# Patient Record
Sex: Female | Born: 1985 | Race: White | Hispanic: No | Marital: Single | Smoking: Former smoker
Health system: Southern US, Community
[De-identification: ages and names within clinical notes are randomized; demographics above are authoritative.]

## PROBLEM LIST (undated history)

## (undated) DIAGNOSIS — IMO0002 Reserved for concepts with insufficient information to code with codable children: Secondary | ICD-10-CM

## (undated) DIAGNOSIS — M329 Systemic lupus erythematosus, unspecified: Secondary | ICD-10-CM

## (undated) DIAGNOSIS — B1920 Unspecified viral hepatitis C without hepatic coma: Secondary | ICD-10-CM

## (undated) HISTORY — PX: FACIAL RECONSTRUCTION SURGERY: SHX631

## (undated) HISTORY — PX: RESECTION TEMPORAL BONE: SUR1252

## (undated) HISTORY — DX: Reserved for concepts with insufficient information to code with codable children: IMO0002

## (undated) HISTORY — DX: Systemic lupus erythematosus, unspecified: M32.9

## (undated) HISTORY — DX: Unspecified viral hepatitis C without hepatic coma: B19.20

## (undated) HISTORY — PX: EYE SURGERY: SHX253

## (undated) HISTORY — PX: HIP SURGERY: SHX245

---

## 2010-03-02 ENCOUNTER — Emergency Department (HOSPITAL_COMMUNITY): Admission: EM | Admit: 2010-03-02 | Discharge: 2010-03-03 | Payer: Self-pay | Admitting: Emergency Medicine

## 2011-01-28 LAB — URINALYSIS, ROUTINE W REFLEX MICROSCOPIC
Bilirubin Urine: NEGATIVE
Hgb urine dipstick: NEGATIVE
Ketones, ur: NEGATIVE mg/dL
Nitrite: NEGATIVE
Protein, ur: NEGATIVE mg/dL
Urobilinogen, UA: 0.2 mg/dL (ref 0.0–1.0)

## 2011-01-28 LAB — WET PREP, GENITAL
Trich, Wet Prep: NONE SEEN
WBC, Wet Prep HPF POC: NONE SEEN
Yeast Wet Prep HPF POC: NONE SEEN

## 2011-01-28 LAB — POCT PREGNANCY, URINE: Preg Test, Ur: NEGATIVE

## 2011-01-28 LAB — GC/CHLAMYDIA PROBE AMP, GENITAL
Chlamydia, DNA Probe: NEGATIVE
GC Probe Amp, Genital: NEGATIVE

## 2014-08-30 DIAGNOSIS — F1111 Opioid abuse, in remission: Secondary | ICD-10-CM | POA: Insufficient documentation

## 2014-08-30 DIAGNOSIS — F909 Attention-deficit hyperactivity disorder, unspecified type: Secondary | ICD-10-CM | POA: Insufficient documentation

## 2014-08-30 DIAGNOSIS — F419 Anxiety disorder, unspecified: Secondary | ICD-10-CM | POA: Insufficient documentation

## 2016-10-07 DIAGNOSIS — Z9889 Other specified postprocedural states: Secondary | ICD-10-CM | POA: Insufficient documentation

## 2016-10-21 DIAGNOSIS — F1420 Cocaine dependence, uncomplicated: Secondary | ICD-10-CM | POA: Insufficient documentation

## 2016-10-21 DIAGNOSIS — F1120 Opioid dependence, uncomplicated: Secondary | ICD-10-CM | POA: Insufficient documentation

## 2018-01-08 ENCOUNTER — Encounter: Payer: Self-pay | Admitting: Obstetrics and Gynecology

## 2018-01-08 ENCOUNTER — Ambulatory Visit (INDEPENDENT_AMBULATORY_CARE_PROVIDER_SITE_OTHER): Admitting: Obstetrics and Gynecology

## 2018-01-08 VITALS — BP 122/82 | HR 76 | Ht 67.0 in | Wt 147.7 lb

## 2018-01-08 DIAGNOSIS — Z3202 Encounter for pregnancy test, result negative: Secondary | ICD-10-CM | POA: Diagnosis not present

## 2018-01-08 DIAGNOSIS — Z23 Encounter for immunization: Secondary | ICD-10-CM

## 2018-01-08 DIAGNOSIS — O0990 Supervision of high risk pregnancy, unspecified, unspecified trimester: Secondary | ICD-10-CM | POA: Insufficient documentation

## 2018-01-08 DIAGNOSIS — Z3687 Encounter for antenatal screening for uncertain dates: Secondary | ICD-10-CM

## 2018-01-08 LAB — POCT URINE PREGNANCY: PREG TEST UR: POSITIVE — AB

## 2018-01-08 NOTE — Patient Instructions (Signed)
First Trimester of Pregnancy The first trimester of pregnancy is from week 1 until the end of week 13 (months 1 through 3). A week after a sperm fertilizes an egg, the egg will implant on the wall of the uterus. This embryo will begin to develop into a baby. Genes from you and your partner will form the baby. The female genes will determine whether the baby will be a boy or a girl. At 6-8 weeks, the eyes and face will be formed, and the heartbeat can be seen on ultrasound. At the end of 12 weeks, all the baby's organs will be formed. Now that you are pregnant, you will want to do everything you can to have a healthy baby. Two of the most important things are to get good prenatal care and to follow your health care provider's instructions. Prenatal care is all the medical care you receive before the baby's birth. This care will help prevent, find, and treat any problems during the pregnancy and childbirth. Body changes during your first trimester Your body goes through many changes during pregnancy. The changes vary from woman to woman.  You may gain or lose a couple of pounds at first.  You may feel sick to your stomach (nauseous) and you may throw up (vomit). If the vomiting is uncontrollable, call your health care provider.  You may tire easily.  You may develop headaches that can be relieved by medicines. All medicines should be approved by your health care provider.  You may urinate more often. Painful urination may mean you have a bladder infection.  You may develop heartburn as a result of your pregnancy.  You may develop constipation because certain hormones are causing the muscles that push stool through your intestines to slow down.  You may develop hemorrhoids or swollen veins (varicose veins).  Your breasts may begin to grow larger and become tender. Your nipples may stick out more, and the tissue that surrounds them (areola) may become darker.  Your gums may bleed and may be  sensitive to brushing and flossing.  Dark spots or blotches (chloasma, mask of pregnancy) may develop on your face. This will likely fade after the baby is born.  Your menstrual periods will stop.  You may have a loss of appetite.  You may develop cravings for certain kinds of food.  You may have changes in your emotions from day to day, such as being excited to be pregnant or being concerned that something may go wrong with the pregnancy and baby.  You may have more vivid and strange dreams.  You may have changes in your hair. These can include thickening of your hair, rapid growth, and changes in texture. Some women also have hair loss during or after pregnancy, or hair that feels dry or thin. Your hair will most likely return to normal after your baby is born.  What to expect at prenatal visits During a routine prenatal visit:  You will be weighed to make sure you and the baby are growing normally.  Your blood pressure will be taken.  Your abdomen will be measured to track your baby's growth.  The fetal heartbeat will be listened to between weeks 10 and 14 of your pregnancy.  Test results from any previous visits will be discussed.  Your health care provider may ask you:  How you are feeling.  If you are feeling the baby move.  If you have had any abnormal symptoms, such as leaking fluid, bleeding, severe headaches,   or abdominal cramping.  If you are using any tobacco products, including cigarettes, chewing tobacco, and electronic cigarettes.  If you have any questions.  Other tests that may be performed during your first trimester include:  Blood tests to find your blood type and to check for the presence of any previous infections. The tests will also be used to check for low iron levels (anemia) and protein on red blood cells (Rh antibodies). Depending on your risk factors, or if you previously had diabetes during pregnancy, you may have tests to check for high blood  sugar that affects pregnant women (gestational diabetes).  Urine tests to check for infections, diabetes, or protein in the urine.  An ultrasound to confirm the proper growth and development of the baby.  Fetal screens for spinal cord problems (spina bifida) and Down syndrome.  HIV (human immunodeficiency virus) testing. Routine prenatal testing includes screening for HIV, unless you choose not to have this test.  You may need other tests to make sure you and the baby are doing well.  Follow these instructions at home: Medicines  Follow your health care provider's instructions regarding medicine use. Specific medicines may be either safe or unsafe to take during pregnancy.  Take a prenatal vitamin that contains at least 600 micrograms (mcg) of folic acid.  If you develop constipation, try taking a stool softener if your health care provider approves. Eating and drinking  Eat a balanced diet that includes fresh fruits and vegetables, whole grains, good sources of protein such as meat, eggs, or tofu, and low-fat dairy. Your health care provider will help you determine the amount of weight gain that is right for you.  Avoid raw meat and uncooked cheese. These carry germs that can cause birth defects in the baby.  Eating four or five small meals rather than three large meals a day may help relieve nausea and vomiting. If you start to feel nauseous, eating a few soda crackers can be helpful. Drinking liquids between meals, instead of during meals, also seems to help ease nausea and vomiting.  Limit foods that are high in fat and processed sugars, such as fried and sweet foods.  To prevent constipation: ? Eat foods that are high in fiber, such as fresh fruits and vegetables, whole grains, and beans. ? Drink enough fluid to keep your urine clear or pale yellow. Activity  Exercise only as directed by your health care provider. Most women can continue their usual exercise routine during  pregnancy. Try to exercise for 30 minutes at least 5 days a week. Exercising will help you: ? Control your weight. ? Stay in shape. ? Be prepared for labor and delivery.  Experiencing pain or cramping in the lower abdomen or lower back is a good sign that you should stop exercising. Check with your health care provider before continuing with normal exercises.  Try to avoid standing for long periods of time. Move your legs often if you must stand in one place for a long time.  Avoid heavy lifting.  Wear low-heeled shoes and practice good posture.  You may continue to have sex unless your health care provider tells you not to. Relieving pain and discomfort  Wear a good support bra to relieve breast tenderness.  Take warm sitz baths to soothe any pain or discomfort caused by hemorrhoids. Use hemorrhoid cream if your health care provider approves.  Rest with your legs elevated if you have leg cramps or low back pain.  If you develop   varicose veins in your legs, wear support hose. Elevate your feet for 15 minutes, 3-4 times a day. Limit salt in your diet. Prenatal care  Schedule your prenatal visits by the twelfth week of pregnancy. They are usually scheduled monthly at first, then more often in the last 2 months before delivery.  Write down your questions. Take them to your prenatal visits.  Keep all your prenatal visits as told by your health care provider. This is important. Safety  Wear your seat belt at all times when driving.  Make a list of emergency phone numbers, including numbers for family, friends, the hospital, and police and fire departments. General instructions  Ask your health care provider for a referral to a local prenatal education class. Begin classes no later than the beginning of month 6 of your pregnancy.  Ask for help if you have counseling or nutritional needs during pregnancy. Your health care provider can offer advice or refer you to specialists for help  with various needs.  Do not use hot tubs, steam rooms, or saunas.  Do not douche or use tampons or scented sanitary pads.  Do not cross your legs for long periods of time.  Avoid cat litter boxes and soil used by cats. These carry germs that can cause birth defects in the baby and possibly loss of the fetus by miscarriage or stillbirth.  Avoid all smoking, herbs, alcohol, and medicines not prescribed by your health care provider. Chemicals in these products affect the formation and growth of the baby.  Do not use any products that contain nicotine or tobacco, such as cigarettes and e-cigarettes. If you need help quitting, ask your health care provider. You may receive counseling support and other resources to help you quit.  Schedule a dentist appointment. At home, brush your teeth with a soft toothbrush and be gentle when you floss. Contact a health care provider if:  You have dizziness.  You have mild pelvic cramps, pelvic pressure, or nagging pain in the abdominal area.  You have persistent nausea, vomiting, or diarrhea.  You have a bad smelling vaginal discharge.  You have pain when you urinate.  You notice increased swelling in your face, hands, legs, or ankles.  You are exposed to fifth disease or chickenpox.  You are exposed to German measles (rubella) and have never had it. Get help right away if:  You have a fever.  You are leaking fluid from your vagina.  You have spotting or bleeding from your vagina.  You have severe abdominal cramping or pain.  You have rapid weight gain or loss.  You vomit blood or material that looks like coffee grounds.  You develop a severe headache.  You have shortness of breath.  You have any kind of trauma, such as from a fall or a car accident. Summary  The first trimester of pregnancy is from week 1 until the end of week 13 (months 1 through 3).  Your body goes through many changes during pregnancy. The changes vary from  woman to woman.  You will have routine prenatal visits. During those visits, your health care provider will examine you, discuss any test results you may have, and talk with you about how you are feeling. This information is not intended to replace advice given to you by your health care provider. Make sure you discuss any questions you have with your health care provider. Document Released: 10/21/2001 Document Revised: 10/08/2016 Document Reviewed: 10/08/2016 Elsevier Interactive Patient Education  2018 [employer]   Inc[employer]  

## 2018-01-08 NOTE — Progress Notes (Signed)
Pt with uncertain LMP, 8-[redacted] weeks gestation ? Will obtained for dating and once dates are confirmed, will bring pt back for new OB

## 2018-01-13 ENCOUNTER — Other Ambulatory Visit: Payer: Self-pay

## 2018-01-13 NOTE — Addendum Note (Signed)
Addended by: Hamilton CapriBURCH, Kosisochukwu Burningham J on: 01/13/2018 03:05 PM   Modules accepted: Orders

## 2018-01-22 ENCOUNTER — Ambulatory Visit (HOSPITAL_COMMUNITY)
Admission: RE | Admit: 2018-01-22 | Discharge: 2018-01-22 | Disposition: A | Discharge status: Home / Self Care | Source: Ambulatory Visit | Attending: Obstetrics and Gynecology | Admitting: Obstetrics and Gynecology

## 2018-01-22 ENCOUNTER — Ambulatory Visit (HOSPITAL_COMMUNITY)

## 2018-01-22 DIAGNOSIS — Z3A08 8 weeks gestation of pregnancy: Secondary | ICD-10-CM | POA: Diagnosis not present

## 2018-01-22 DIAGNOSIS — Z3687 Encounter for antenatal screening for uncertain dates: Secondary | ICD-10-CM | POA: Diagnosis present

## 2018-02-04 ENCOUNTER — Encounter: Payer: Self-pay | Admitting: Obstetrics and Gynecology

## 2018-02-04 ENCOUNTER — Other Ambulatory Visit (HOSPITAL_COMMUNITY)
Admission: RE | Admit: 2018-02-04 | Discharge: 2018-02-04 | Disposition: A | Discharge status: Home / Self Care | Source: Ambulatory Visit | Attending: Obstetrics and Gynecology | Admitting: Obstetrics and Gynecology

## 2018-02-04 ENCOUNTER — Ambulatory Visit (INDEPENDENT_AMBULATORY_CARE_PROVIDER_SITE_OTHER): Admitting: Obstetrics and Gynecology

## 2018-02-04 DIAGNOSIS — O0991 Supervision of high risk pregnancy, unspecified, first trimester: Secondary | ICD-10-CM | POA: Diagnosis not present

## 2018-02-04 DIAGNOSIS — O99321 Drug use complicating pregnancy, first trimester: Secondary | ICD-10-CM | POA: Diagnosis not present

## 2018-02-04 DIAGNOSIS — O34219 Maternal care for unspecified type scar from previous cesarean delivery: Secondary | ICD-10-CM | POA: Insufficient documentation

## 2018-02-04 DIAGNOSIS — Z3A10 10 weeks gestation of pregnancy: Secondary | ICD-10-CM | POA: Insufficient documentation

## 2018-02-04 DIAGNOSIS — O99320 Drug use complicating pregnancy, unspecified trimester: Secondary | ICD-10-CM

## 2018-02-04 DIAGNOSIS — Z98891 History of uterine scar from previous surgery: Secondary | ICD-10-CM | POA: Insufficient documentation

## 2018-02-04 DIAGNOSIS — O0990 Supervision of high risk pregnancy, unspecified, unspecified trimester: Secondary | ICD-10-CM

## 2018-02-04 NOTE — Progress Notes (Signed)
   PRENATAL VISIT NOTE  Subjective:  Rebekah Chavez is a 32 y.o. [phone] at 4957w4d being seen today for initial prenatal care visit.  She is currently monitored for the following issues for this high-risk pregnancy and has Supervision of high risk pregnancy, antepartum; Previous cesarean section complicating pregnancy, antepartum condition or complication; and Substance abuse affecting pregnancy, antepartum on their problem list.  Patient reports the presence of a white discharge.  Contractions: Not present. Vag. Bleeding: None.  Movement: Absent. Denies leaking of fluid.   The following portions of the patient's history were reviewed and updated as appropriate: allergies, current medications, past family history, past medical history, past social history, past surgical history and problem list. Problem list updated.  Objective:   Vitals:   02/04/18 0813  BP: 102/69  Pulse: 80  Weight: 153 lb 8 oz (69.6 kg)    Fetal Status: Fetal Heart Rate (bpm): 160   Movement: Absent     General:  Alert, oriented and cooperative. Patient is in no acute distress.  Skin: Skin is warm and dry. No rash noted.   Cardiovascular: Normal heart rate noted  Respiratory: Normal respiratory effort, no problems with respiration noted  Abdomen: Soft, gravid, appropriate for gestational age.  Pain/Pressure: Absent     Pelvic: Cervical exam performed        Extremities: Normal range of motion.  Edema: None  Mental Status:  Normal mood and affect. Normal behavior. Normal judgment and thought content.   Assessment and Plan:  Pregnancy: [id number] at 4757w4d  1. Supervision of high risk pregnancy, antepartum Patient is doing well without complaints Reviewed dating with the patient - Culture, OB Urine - Obstetric Panel, Including HIV - Hemoglobinopathy evaluation - VITAMIN D 25 Hydroxy (Vit-D Deficiency, Fractures) - Cystic Fibrosis Mutation 97 - Cytology - PAP - Genetic Screening - Hemoglobin A1c - SMN1 COPY  NUMBER ANALYSIS (SMA Carrier Screen)  2. Previous cesarean section complicating pregnancy, antepartum condition or complication Patient states emergency c-section due to non reassuring NST at 34 weeks Records requested  3. 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 Please refer to After Visit Summary for other counseling recommendations.  Return in about 1 month (around 03/04/2018) for ROB.   Catalina AntiguaPeggy Rabecca Birge, MD

## 2018-02-04 NOTE — Addendum Note (Signed)
Addended by: Catalina AntiguaONSTANT, Sostenes Kauffmann on: 02/04/2018 08:53 AM   Modules accepted: Kipp BroodSmartSet

## 2018-02-04 NOTE — Progress Notes (Signed)
Pt c/o white discharge with odor x couple months.,

## 2018-02-05 LAB — VITAMIN D 25 HYDROXY (VIT D DEFICIENCY, FRACTURES): VIT D 25 HYDROXY: 29.9 ng/mL — AB (ref 30.0–100.0)

## 2018-02-06 LAB — URINE CULTURE, OB REFLEX

## 2018-02-06 LAB — CULTURE, OB URINE

## 2018-02-08 LAB — CYTOLOGY - PAP
Bacterial vaginitis: POSITIVE — AB
CANDIDA VAGINITIS: NEGATIVE
Chlamydia: NEGATIVE
DIAGNOSIS: NEGATIVE
HPV (WINDOPATH): DETECTED — AB
NEISSERIA GONORRHEA: NEGATIVE
TRICH (WINDOWPATH): POSITIVE — AB

## 2018-02-08 MED ORDER — METRONIDAZOLE 500 MG PO TABS: 500.0000 mg | ORAL_TABLET | Freq: Two times a day (BID) | ORAL | 0 refills | Status: DC

## 2018-02-08 NOTE — Addendum Note (Signed)
Addended by: Catalina AntiguaONSTANT, Kahlie Deutscher on: 02/08/2018 03:16 PM   Modules accepted: Orders

## 2018-02-09 ENCOUNTER — Telehealth: Payer: Self-pay

## 2018-02-09 NOTE — Telephone Encounter (Signed)
-----   Message from Catalina AntiguaPeggy Constant, MD sent at 02/08/2018  3:14 PM EDT ----- Please inform patient of BV and trich infection. She needs a prescription for Flagyl 500 mg twice daily for 7 days (disp 14 tablets without refills). Order in Epic Patient is currently incarcerated  Thanks  Peggy

## 2018-02-09 NOTE — Telephone Encounter (Signed)
TC to Sheriff's office "[place]" will help get patient treated once she receives copy of results and the medication order. Faxed today to secure fax [phone] atten: Cicero DuckErika.

## 2018-02-10 ENCOUNTER — Other Ambulatory Visit: Payer: Self-pay

## 2018-02-10 LAB — OBSTETRIC PANEL, INCLUDING HIV
Antibody Screen: NEGATIVE
BASOS ABS: 0 10*3/uL (ref 0.0–0.2)
Basos: 0 %
EOS (ABSOLUTE): 0.1 10*3/uL (ref 0.0–0.4)
Eos: 3 %
HIV SCREEN 4TH GENERATION: NONREACTIVE
Hematocrit: 39.4 % (ref 34.0–46.6)
Hemoglobin: 13 g/dL (ref 11.1–15.9)
Hepatitis B Surface Ag: NEGATIVE
IMMATURE GRANULOCYTES: 0 %
Immature Grans (Abs): 0 10*3/uL (ref 0.0–0.1)
Lymphocytes Absolute: 1.2 10*3/uL (ref 0.7–3.1)
Lymphs: 28 %
MCH: 28.6 pg (ref 26.6–33.0)
MCHC: 33 g/dL (ref 31.5–35.7)
MCV: 87 fL (ref 79–97)
MONOS ABS: 0.4 10*3/uL (ref 0.1–0.9)
Monocytes: 10 %
NEUTROS ABS: 2.4 10*3/uL (ref 1.4–7.0)
NEUTROS PCT: 59 %
PLATELETS: 202 10*3/uL (ref 150–379)
RBC: 4.54 x10E6/uL (ref 3.77–5.28)
RDW: 14.3 % (ref 12.3–15.4)
RPR Ser Ql: NONREACTIVE
Rh Factor: POSITIVE
Rubella Antibodies, IGG: 2.25 index (ref >0.99)
WBC: 4.2 10*3/uL (ref 3.4–10.8)

## 2018-02-10 LAB — HEMOGLOBINOPATHY EVALUATION
HEMOGLOBIN F QUANTITATION: 0 % (ref 0.0–2.0)
HGB A: 97.3 % (ref 96.4–98.8)
HGB C: 0 %
HGB S: 0 %
HGB VARIANT: 0 %
Hemoglobin A2 Quantitation: 2.7 % (ref 1.8–3.2)

## 2018-02-10 LAB — HEMOGLOBIN A1C
ESTIMATED AVERAGE GLUCOSE: 100 mg/dL
Hgb A1c MFr Bld: 5.1 % (ref 4.8–5.6)

## 2018-02-11 LAB — CYSTIC FIBROSIS MUTATION 97: Interpretation: NOT DETECTED

## 2018-02-16 LAB — SMN1 COPY NUMBER ANALYSIS (SMA CARRIER SCREENING)

## 2018-03-02 ENCOUNTER — Telehealth: Payer: Self-pay | Admitting: *Deleted

## 2018-03-02 NOTE — Telephone Encounter (Signed)
Call placed to pt to reschedule ROB appt. LM on VM to call office to schedule.

## 2018-03-04 ENCOUNTER — Encounter: Admitting: Obstetrics and Gynecology

## 2018-03-31 ENCOUNTER — Encounter: Payer: Medicaid Other | Admitting: Obstetrics & Gynecology

## 2018-04-20 ENCOUNTER — Encounter: Payer: Self-pay | Admitting: *Deleted

## 2018-04-28 ENCOUNTER — Telehealth: Payer: Self-pay | Admitting: *Deleted

## 2018-04-28 NOTE — Telephone Encounter (Signed)
Lft vmail for patient to callback and reschedule Ob visit, patient has missed several appts. Has not been seen since 01/2018.Marland Kitchen.Marland Kitchen..Marland Kitchen

## 2018-05-21 ENCOUNTER — Ambulatory Visit (INDEPENDENT_AMBULATORY_CARE_PROVIDER_SITE_OTHER): Payer: Medicaid Other | Admitting: Nurse Practitioner

## 2018-05-21 ENCOUNTER — Encounter: Payer: Self-pay | Admitting: *Deleted

## 2018-05-21 VITALS — BP 110/71 | HR 81 | Wt 162.2 lb

## 2018-05-21 DIAGNOSIS — O0930 Supervision of pregnancy with insufficient antenatal care, unspecified trimester: Secondary | ICD-10-CM

## 2018-05-21 DIAGNOSIS — O0932 Supervision of pregnancy with insufficient antenatal care, second trimester: Secondary | ICD-10-CM

## 2018-05-21 DIAGNOSIS — O99322 Drug use complicating pregnancy, second trimester: Secondary | ICD-10-CM

## 2018-05-21 DIAGNOSIS — A599 Trichomoniasis, unspecified: Secondary | ICD-10-CM | POA: Insufficient documentation

## 2018-05-21 DIAGNOSIS — O0992 Supervision of high risk pregnancy, unspecified, second trimester: Secondary | ICD-10-CM

## 2018-05-21 DIAGNOSIS — Z9889 Other specified postprocedural states: Secondary | ICD-10-CM

## 2018-05-21 DIAGNOSIS — O0990 Supervision of high risk pregnancy, unspecified, unspecified trimester: Secondary | ICD-10-CM

## 2018-05-21 DIAGNOSIS — O99320 Drug use complicating pregnancy, unspecified trimester: Secondary | ICD-10-CM

## 2018-05-21 DIAGNOSIS — Z1589 Genetic susceptibility to other disease: Secondary | ICD-10-CM

## 2018-05-21 MED ORDER — METRONIDAZOLE 500 MG PO TABS: 2000.0000 mg | ORAL_TABLET | Freq: Once | ORAL | 0 refills | Status: AC

## 2018-05-21 MED ORDER — POLYETHYLENE GLYCOL 3350 17 GM/SCOOP PO POWD: ORAL | 11 refills | Status: AC

## 2018-05-21 MED ORDER — CONCEPT OB 130-92.4-1 MG PO CAPS: 1.0000 | ORAL_CAPSULE | Freq: Every day | ORAL | 11 refills | Status: AC

## 2018-05-21 NOTE — Progress Notes (Signed)
[facility]   Subjective:  Rebekah CruiseShelly Strege is a 32 y.o. [phone] at 6260w5d being seen today for ongoing prenatal care.  She is currently monitored for the following issues for this high-risk pregnancy and has Supervision of high risk pregnancy, antepartum; Previous cesarean section complicating pregnancy, antepartum condition or complication; Substance abuse affecting pregnancy, antepartum; Late prenatal care affecting pregnancy, antepartum; History of facial surgery; Monoallelic mutation of SMN1 gene; and Trichimoniasis on their problem list.  Patient reports restarting care again since March 2010.  Having some vaginal discharge - partner was not treated for trich and she has been having sex so she thinks it is a reinfection.  Asking for medication again and medication for her partner.  .  Contractions: Not present. Vag. Bleeding: None.  Movement: Present. Denies leaking of fluid.   The following portions of the patient's history were reviewed and updated as appropriate: allergies, current medications, past family history, past medical history, past social history, past surgical history and problem list. Problem list updated.  Objective:   Vitals:   05/21/18 1025  BP: 110/71  Pulse: 81  Weight: 162 lb 3.2 oz (73.6 kg)    Fetal Status: Fetal Heart Rate (bpm): 132 Fundal Height: 23 cm Movement: Present     General:  Alert, oriented and cooperative. Patient is in no acute distress.  Skin: Skin is warm and dry. No rash noted.   Cardiovascular: Normal heart rate noted  Respiratory: Normal respiratory effort, no problems with respiration noted  Abdomen: Soft, gravid, appropriate for gestational age. Pain/Pressure: Absent     Pelvic:  Cervical exam deferred        Extremities: Normal range of motion.  Edema: None  Mental Status: Normal mood and affect. Normal behavior. Normal judgment and thought content.     Assessment and Plan:  Pregnancy: [id number] at 1060w5d 1.  Supervision of high risk pregnancy  - scheduled detailed anatomy US today.  Requested genetic counseling as client is a SMN1 carrier 2.  Substance abuse - still on methadone 3.  Late to care - only one visit prior to 25 weeks 4.  Previous emergency C/S at 34 weeks due to a decel during biweekly monitoring. Records requested at first prenatal visit but still no records.  Contacted office manager to get requested records. 5.  Trichomonas - prescribed medication for client and gave partner a prescription as well.  Advised taking treatment at the same time for both individuals and abstaining for 7 days.  Preterm labor symptoms and general obstetric precautions including but not limited to vaginal bleeding, contractions, leaking of fluid and fetal movement were reviewed in detail with the patient. Please refer to After Visit Summary for other counseling recommendations.  Return in about 3 weeks (around 06/11/2018).  Nolene BernheimERRI Sharica Roedel, RN, MSN, NP-BC Nurse Practitioner, [facility], [facility] 05/21/2018 2:25 PM

## 2018-05-21 NOTE — Patient Instructions (Addendum)
BENEFITS OF BREASTFEEDING Many women wonder if they should breastfeed. Research shows that breast milk contains the perfect balance of vitamins, protein and fat that your baby needs to grow. It also contains antibodies that help your baby's immune system to fight off viruses and bacteria and can reduce the risk of sudden infant death syndrome (SIDS). In addition, the colostrum (a fluid secreted from the breast in the first few days after delivery) helps your newborn's digestive system to grow and function well. Breast milk is easier to digest than formula. Also, if your baby is born preterm, breast milk can help to reduce both short- and long-term health problems. BENEFITS OF BREASTFEEDING FOR MOM . Breastfeeding causes a hormone to be released that helps the uterus to contract and return to its normal size more quickly. . It aids in postpartum weight loss, reduces risk of breast and ovarian cancer, heart disease and rheumatoid arthritis. . It decreases the amount of bleeding after the baby is born. benefits of breastfeeding for baby . Provides comfort and nutrition . Protects baby against - Obesity - Diabetes - Asthma - Childhood cancers - Heart disease - Ear infections - Diarrhea - Pneumonia - Stomach problems - Serious allergies - Skin rashes . Promotes growth and development . Reduces the risk of baby having Sudden Infant Death Syndrome (SIDS) only breastmilk for the first 6 months . Protects baby against diseases/allergies . It's the perfect amount for tiny bellies . It restores baby's energy . Provides the best nutrition for baby . Giving water or formula can make baby more likely to get sick, decrease Mom's milk supply, make baby less content with breastfeeding Skin to Skin After delivery, the staff will place your baby on your chest. This helps with the following: . Regulates baby's temperature, breathing, heart rate and blood sugar . Increases Mom's milk supply . Promotes  bonding . Keeps baby and Mom calm and decreases baby's crying Rooming In Your baby will stay in your room with you for the entire time you are in the hospital. This helps with the following: . Allows Mom to learn baby's feeding cues - Fluttering eyes - Sucking on tongue or hand - Rooting (opens mouth and turns head) - Nuzzling into the breast - Bringing hand to mouth . Allows breastfeeding on demand (when your baby is ready) . Helps baby to be calm and content . Ensures a good milk supply . Prevents complications with breastfeeding . Allows parents to learn to care for baby . Allows you to request assistance with breastfeeding Importance of a good latch . Increases milk transfer to baby - baby gets enough milk . Ensures you have enough milk for your baby . Decreases nipple soreness . Don't use pacifiers and bottles - these cause baby to suck differently than breastfeeding . Promotes continuation of breastfeeding Risks of Formula Supplementation with Breastfeeding Giving your infant formula in addition to your breast-milk EXCEPT when medically necessary can lead to: . Decreases your milk supply  . Loss of confidence in yourself for providing baby's nutrition  . Engorgement and possibly mastitis  . Asthma & allergies in the baby BREASTFEEDING FAQS How long should I breastfeed my baby? It is recommended that you provide your baby with breast milk only for the first 6 months and then continue for the first year and longer as desired. During the first few weeks after birth, your baby will need to feed 8-12 times every 24 hours, or every 2-3 hours. They will likely feed   for 15-30 minutes. How can I help my baby begin breastfeeding? Babies are born with an instinct to breastfeed. A healthy baby can begin breastfeeding right away without specific help. At the hospital, a nurse (or lactation consultant) will help you begin the process and will give you tips on good positioning. It may be  helpful to take a breastfeeding class before you deliver in order to know what to expect. How can I help my baby latch on? In order to assist your baby in latching-on, cup your breast in your hand and stroke your baby's lower lip with your nipple to stimulate your baby's rooting reflex. Your baby will look like he or she is yawning, at which point you should bring the baby towards your breast, while aiming the nipple at the roof of his or her mouth. Remember to bring the baby towards you and not your breast towards the baby. How can I tell if my baby is latched-on? Your baby will have all of your nipple and part of the dark area around the nipple in his or her mouth and your baby's nose will be touching your breast. You should see or hear the baby swallowing. If the baby is not latched-on properly, start the process over. To remove the suction, insert a clean finger between your breast and the baby's mouth. Should I switch breasts during feeding? After feeding on one side, switch the baby to your other breast. If he or she does not continue feeding - that is OK. Your baby will not necessarily need to feed from both breasts in a single feeding. On the next feeding, start with the other breast for efficiency and comfort. How can I tell if my baby is hungry? When your baby is hungry, they will nuzzle against your breast, make sucking noises and tongue motions and may put their hands near their mouth. Crying is a late sign of hunger, so you should not wait until this point. When they have received enough milk, they will unlatch from the breast. Is it okay to use a pacifier? Until your baby gets the hang of breastfeeding, experts recommend limiting pacifier usage. If you have questions about this, please contact your pediatrician. What can I do to ensure proper nutrition while breastfeeding? . Make sure that you support your own health and your baby's by eating a healthy, well-balanced diet . Your provider  may recommend that you continue to take your prenatal vitamin . Drink plenty of fluids. It is a good rule to drink one glass of water before or after feeding . Alcohol will remain in the breast milk for as long as it will remain in the blood stream. If you choose to have a drink, it is recommended that you wait at least 2 hours before feeding . Moderate amounts of caffeine are OK . Some over-the-counter or prescription medications are not recommended during breastfeeding. Check with your provider if you have questions What types of birth control methods are safe while breastfeeding? Progestin-only methods, including a daily pill, an IUD, the implant and the injection are safe while breastfeeding. Methods that contain estrogen (such as combination birth control pills, the vaginal ring and the patch) should not be used during the first month of breastfeeding as these can decrease your milk supply.    Trichomoniasis Trichomoniasis is an STI (sexually transmitted infection) that can affect both women and men. In women, the outer area of the female genitalia (vulva) and the vagina are affected. In men,  the penis is mainly affected, but the prostate and other reproductive organs can also be involved. This condition can be treated with medicine. It often has no symptoms (is asymptomatic), especially in men. What are the causes? This condition is caused by an organism called Trichomonas vaginalis. Trichomoniasis most often spreads from person to person (is contagious) through sexual contact. What increases the risk? The following factors may make you more likely to develop this condition:  Having unprotected sexual intercourse.  Having sexual intercourse with a partner who has trichomoniasis.  Having multiple sexual partners.  Having had previous trichomoniasis infections or other STIs.  What are the signs or symptoms? In women, symptoms of trichomoniasis include:  Abnormal vaginal discharge that  is clear, white, gray, or yellow-green and foamy and has an unusual "fishy" odor.  Itching and irritation of the vagina and vulva.  Burning or pain during urination or sexual intercourse.  Genital redness and swelling.  In men, symptoms of trichomoniasis include:  Penile discharge that may be foamy or contain pus.  Pain in the penis. This may happen only when urinating.  Itching or irritation inside the penis.  Burning after urination or ejaculation.  How is this diagnosed? In women, this condition may be found during a routine Pap test or physical exam. It may be found in men during a routine physical exam. Your health care provider may perform tests to help diagnose this infection, such as:  Urine tests (men and women).  The following in women: ? Testing the pH of the vagina. ? A vaginal swab test that checks for the Trichomonas vaginalis organism. ? Testing vaginal secretions.  Your health care provider may test you for other STIs, including HIV (human immunodeficiency virus). How is this treated? This condition is treated with medicine taken by mouth (orally), such as metronidazole or tinidazole to fight the infection. Your sexual partner(s) may also need to be tested and treated.  If you are a woman and you plan to become pregnant or think you may be pregnant, tell your health care provider right away. Some medicines that are used to treat the infection should not be taken during pregnancy.  Your health care provider may recommend over-the-counter medicines or creams to help relieve itching or irritation. You may be tested for infection again 3 months after treatment. Follow these instructions at home:  Take and use over-the-counter and prescription medicines, including creams, only as told by your health care provider.  Do not have sexual intercourse until one week after you finish your medicine, or until your health care provider approves. Ask your health care provider  when you may resume sexual intercourse.  (Women) Do not douche or wear tampons while you have the infection.  Discuss your infection with your sexual partner(s). Make sure that your partner gets tested and treated, if necessary.  Keep all follow-up visits as told by your health care provider. This is important. How is this prevented?  Use condoms every time you have sex. Using condoms correctly and consistently can help protect against STIs.  Avoid having multiple sexual partners.  Talk with your sexual partner about any symptoms that either of you may have, as well as any history of STIs.  Get tested for STIs and STDs (sexually transmitted diseases) before you have sex. Ask your partner to do the same.  Do not have sexual contact if you have symptoms of trichomoniasis or another STI. Contact a health care provider if:  You still have symptoms after  you finish your medicine.  You develop pain in your abdomen.  You have pain when you urinate.  You have bleeding after sexual intercourse.  You develop a rash.  You feel nauseous or you vomit.  You plan to become pregnant or think you may be pregnant. Summary  Trichomoniasis is an STI (sexually transmitted infection) that can affect both women and men.  This condition often has no symptoms (is asymptomatic), especially in men.  You should not have sexual intercourse until one week after you finish your medicine, or until your health care provider approves. Ask your health care provider when you may resume sexual intercourse.  Discuss your infection with your sexual partner. Make sure that your partner gets tested and treated, if necessary. This information is not intended to replace advice given to you by your health care provider. Make sure you discuss any questions you have with your health care provider. Document Released: 04/22/2001 Document Revised: 09/19/2016 Document Reviewed: 09/19/2016 Elsevier Interactive Patient  Education  2017 [employer].

## 2018-05-25 ENCOUNTER — Encounter: Payer: Self-pay | Admitting: *Deleted

## 2018-05-28 ENCOUNTER — Encounter (HOSPITAL_COMMUNITY): Payer: Self-pay

## 2018-06-04 ENCOUNTER — Ambulatory Visit (HOSPITAL_COMMUNITY)
Admission: RE | Admit: 2018-06-04 | Discharge: 2018-06-04 | Disposition: A | Discharge status: Home / Self Care | Source: Ambulatory Visit | Attending: Nurse Practitioner | Admitting: Nurse Practitioner

## 2018-06-04 ENCOUNTER — Encounter: Payer: Self-pay | Admitting: *Deleted

## 2018-06-04 ENCOUNTER — Encounter (HOSPITAL_COMMUNITY)

## 2018-06-09 ENCOUNTER — Encounter (HOSPITAL_COMMUNITY): Payer: Self-pay

## 2018-06-10 ENCOUNTER — Encounter: Admitting: Obstetrics and Gynecology

## 2018-06-11 ENCOUNTER — Encounter: Admitting: Obstetrics

## 2018-06-11 ENCOUNTER — Other Ambulatory Visit: Payer: Medicaid Other

## 2018-06-15 ENCOUNTER — Encounter: Payer: Self-pay | Admitting: *Deleted

## 2018-06-16 ENCOUNTER — Ambulatory Visit (HOSPITAL_COMMUNITY)
Admission: RE | Admit: 2018-06-16 | Discharge: 2018-06-16 | Disposition: A | Discharge status: Home / Self Care | Payer: Medicaid Other | Source: Ambulatory Visit | Attending: Nurse Practitioner | Admitting: Nurse Practitioner

## 2018-06-16 ENCOUNTER — Ambulatory Visit (HOSPITAL_COMMUNITY): Payer: Self-pay

## 2018-06-16 ENCOUNTER — Encounter (HOSPITAL_COMMUNITY): Payer: Self-pay

## 2018-06-16 DIAGNOSIS — O0990 Supervision of high risk pregnancy, unspecified, unspecified trimester: Secondary | ICD-10-CM | POA: Insufficient documentation

## 2018-06-17 NOTE — ED Notes (Signed)
Pt left without having labs drawn and detail ultrasound.

## 2018-06-21 ENCOUNTER — Encounter: Payer: Self-pay | Admitting: *Deleted

## 2018-07-05 ENCOUNTER — Telehealth: Payer: Self-pay

## 2018-07-05 NOTE — Telephone Encounter (Signed)
TC from pt on VM on Friday 07/02/17 after office was closed with complaints of excessive swelling and itching all over her body.   Pt states she is still having symptoms   Denies any visual changes, no headaches, +FM, no leaking of fluids no bleeding.  Pt has appt scheduled this week however since swelling per pt is excessive and itching still present she needs to be evaluated.   Due to no ava she should report to [facility]. Pt agreed and voiced understanding.

## 2018-07-06 ENCOUNTER — Other Ambulatory Visit: Payer: Medicaid Other

## 2018-07-06 DIAGNOSIS — O0990 Supervision of high risk pregnancy, unspecified, unspecified trimester: Secondary | ICD-10-CM

## 2018-07-07 ENCOUNTER — Other Ambulatory Visit (HOSPITAL_COMMUNITY): Payer: Self-pay | Admitting: *Deleted

## 2018-07-07 DIAGNOSIS — O99323 Drug use complicating pregnancy, third trimester: Principal | ICD-10-CM

## 2018-07-07 DIAGNOSIS — Z363 Encounter for antenatal screening for malformations: Secondary | ICD-10-CM

## 2018-07-07 DIAGNOSIS — F1120 Opioid dependence, uncomplicated: Secondary | ICD-10-CM

## 2018-07-08 ENCOUNTER — Encounter: Payer: Self-pay | Admitting: Obstetrics and Gynecology

## 2018-07-08 ENCOUNTER — Ambulatory Visit (HOSPITAL_COMMUNITY)
Admission: RE | Admit: 2018-07-08 | Source: Ambulatory Visit | Attending: Nurse Practitioner | Admitting: Nurse Practitioner

## 2018-07-08 ENCOUNTER — Other Ambulatory Visit (HOSPITAL_COMMUNITY)
Admission: RE | Admit: 2018-07-08 | Discharge: 2018-07-08 | Disposition: A | Discharge status: Home / Self Care | Payer: Medicaid Other | Source: Ambulatory Visit | Attending: Obstetrics and Gynecology | Admitting: Obstetrics and Gynecology

## 2018-07-08 ENCOUNTER — Ambulatory Visit (INDEPENDENT_AMBULATORY_CARE_PROVIDER_SITE_OTHER): Payer: Medicaid Other | Admitting: Obstetrics and Gynecology

## 2018-07-08 VITALS — BP 119/74 | HR 74 | Wt 170.4 lb

## 2018-07-08 DIAGNOSIS — Z3A32 32 weeks gestation of pregnancy: Secondary | ICD-10-CM | POA: Diagnosis not present

## 2018-07-08 DIAGNOSIS — A599 Trichomoniasis, unspecified: Secondary | ICD-10-CM

## 2018-07-08 DIAGNOSIS — G129 Spinal muscular atrophy, unspecified: Secondary | ICD-10-CM

## 2018-07-08 DIAGNOSIS — O99320 Drug use complicating pregnancy, unspecified trimester: Secondary | ICD-10-CM

## 2018-07-08 DIAGNOSIS — O0990 Supervision of high risk pregnancy, unspecified, unspecified trimester: Secondary | ICD-10-CM

## 2018-07-08 DIAGNOSIS — Z8619 Personal history of other infectious and parasitic diseases: Secondary | ICD-10-CM

## 2018-07-08 DIAGNOSIS — Z87898 Personal history of other specified conditions: Secondary | ICD-10-CM

## 2018-07-08 DIAGNOSIS — O99323 Drug use complicating pregnancy, third trimester: Secondary | ICD-10-CM

## 2018-07-08 DIAGNOSIS — O0993 Supervision of high risk pregnancy, unspecified, third trimester: Secondary | ICD-10-CM | POA: Diagnosis not present

## 2018-07-08 DIAGNOSIS — O34219 Maternal care for unspecified type scar from previous cesarean delivery: Secondary | ICD-10-CM

## 2018-07-08 NOTE — Progress Notes (Signed)
Prenatal Visit Note Date: 07/08/2018 Clinic: [facility]  Subjective:  Rebekah Chavez is a 32 y.o. [phone] at 4270w4d being seen today for ongoing prenatal care.  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  Patient reports having mild withdrawal s/s. last methadone dose a week ago (see below).   Contractions: Not present. Vag. Bleeding: None.  Movement: Present. Denies leaking of fluid.   The following portions of the patient's history were reviewed and updated as appropriate: allergies, current medications, past family history, past medical history, past social history, past surgical history and problem list. Problem list updated.  Objective:   Vitals:   07/08/18 1352  BP: 119/74  Pulse: 74  Weight: 170 lb 6.4 oz (77.3 kg)    Fetal Status: Fetal Heart Rate (bpm): 145 Fundal Height: 30 cm Movement: Present     General:  Alert, oriented and cooperative. Patient is in no acute distress.  Skin: Skin is warm and dry. No rash noted.   Cardiovascular: Normal heart rate noted  Respiratory: Normal respiratory effort, no problems with respiration noted  Abdomen: Soft, gravid, appropriate for gestational age. Pain/Pressure: Absent     Pelvic:  Cervical exam deferred        Extremities: Normal range of motion.  Edema: Mild pitting, slight indentation  Mental Status: Normal mood and affect. Normal behavior. Normal judgment and thought content.   Urinalysis:      Assessment and Plan:  Pregnancy: [id number] at 4170w4d  1. Supervision of high risk pregnancy, antepartum btl  papers today. 28wk labs from Tuesday still pending - Cervicovaginal ancillary only - HCV Ab w Reflex to Quant PCR - Comprehensive metabolic panel - INR/PT - APTT - Fibrinogen  2. Previous cesarean section complicating pregnancy, antepartum condition or complication Op note reviewed. tolac consent form signed today and pt wants a repeat c-section and btl. Request sent to scheduling.   3. Substance abuse affecting pregnancy, antepartum Was on methadone 110 followed in [place], [place] but they don't take medicaid and she states that the only clinic in [place] that takes it has a back log. I told her that unfortunately I have to recommend that she go to the ED in [place] to see if she can get plugged in to a place sooner since that is where she lives.   4. Trichimoniasis toc today - Cervicovaginal ancillary only  5. maternal SMA carrier S/p gc already  6. History of hepatitis C Seen in care everywhere. Will get baseline labs today - HCV Ab w Reflex to Quant PCR - Comprehensive metabolic panel - INR/PT - APTT - Fibrinogen  7. History of poor fetal growth Will see if front desk can move up anatomy, growth from next week to any sooner.   Preterm labor symptoms and general obstetric precautions including but not limited to vaginal bleeding, contractions, leaking of fluid and fetal movement were reviewed in detail with the patient. Please refer to After Visit Summary for other counseling recommendations.  Return in about 1 week (around 07/15/2018) for hrob.   [place], Kinsey Cowsert, MD

## 2018-07-08 NOTE — Progress Notes (Signed)
Pt states that she has been off of her methadone for 7 days due to not being able to afford medication. Pt would like to be referred to a treatment center who takes medicaid.

## 2018-07-09 ENCOUNTER — Encounter (HOSPITAL_COMMUNITY): Payer: Self-pay

## 2018-07-09 LAB — CERVICOVAGINAL ANCILLARY ONLY
CHLAMYDIA, DNA PROBE: NEGATIVE
NEISSERIA GONORRHEA: NEGATIVE
TRICH (WINDOWPATH): NEGATIVE

## 2018-07-10 LAB — CBC
Hematocrit: 35.6 % (ref 34.0–46.6)
Hemoglobin: 11.7 g/dL (ref 11.1–15.9)
MCH: 29.5 pg (ref 26.6–33.0)
MCHC: 32.9 g/dL (ref 31.5–35.7)
MCV: 90 fL (ref 79–97)
Platelets: 190 10*3/uL (ref 150–450)
RBC: 3.96 x10E6/uL (ref 3.77–5.28)
RDW: 13.6 % (ref 12.3–15.4)
WBC: 6.2 10*3/uL (ref 3.4–10.8)

## 2018-07-10 LAB — GLUCOSE TOLERANCE, 2 HOURS W/ 1HR: Glucose, Fasting: 78 mg/dL (ref 65–91)

## 2018-07-10 LAB — HIV ANTIBODY (ROUTINE TESTING W REFLEX): HIV SCREEN 4TH GENERATION: NONREACTIVE

## 2018-07-10 LAB — RPR: RPR Ser Ql: NONREACTIVE

## 2018-07-11 LAB — HCV AB W REFLEX TO QUANT PCR: HCV Ab: >11 s/co ratio — ABNORMAL HIGH (ref 0.0–0.9)

## 2018-07-11 LAB — COMPREHENSIVE METABOLIC PANEL
ALBUMIN: 2.9 g/dL — AB (ref 3.5–5.5)
ALK PHOS: 149 IU/L — AB (ref 39–117)
ALT: 27 IU/L (ref 0–32)
AST: 36 IU/L (ref 0–40)
Albumin/Globulin Ratio: 1 — ABNORMAL LOW (ref 1.2–2.2)
BILIRUBIN TOTAL: 0.3 mg/dL (ref 0.0–1.2)
BUN/Creatinine Ratio: 9 (ref 9–23)
BUN: 5 mg/dL — AB (ref 6–20)
CHLORIDE: 105 mmol/L (ref 96–106)
CO2: 21 mmol/L (ref 20–29)
CREATININE: 0.53 mg/dL — AB (ref 0.57–1.00)
Calcium: 8.7 mg/dL (ref 8.7–10.2)
GFR calc Af Amer: 147 mL/min/{1.73_m2} (ref >59)
GFR calc non Af Amer: 128 mL/min/{1.73_m2} (ref >59)
GLOBULIN, TOTAL: 3 g/dL (ref 1.5–4.5)
GLUCOSE: 86 mg/dL (ref 65–99)
Potassium: 2.9 mmol/L — ABNORMAL LOW (ref 3.5–5.2)
SODIUM: 141 mmol/L (ref 134–144)
Total Protein: 5.9 g/dL — ABNORMAL LOW (ref 6.0–8.5)

## 2018-07-11 LAB — FIBRINOGEN: Fibrinogen: 256 mg/dL (ref 193–507)

## 2018-07-11 LAB — HCV RT-PCR, QUANT (NON-GRAPH)
HCV log10: 6.551 log10 IU/mL
HEPATITIS C QUANTITATION: 3560000 [IU]/mL

## 2018-07-11 LAB — PROTIME-INR
INR: 0.9 (ref 0.8–1.2)
PROTHROMBIN TIME: 9.7 s (ref 9.1–12.0)

## 2018-07-11 LAB — APTT: aPTT: 28 s (ref 24–33)

## 2018-07-13 ENCOUNTER — Encounter: Payer: Self-pay | Admitting: *Deleted

## 2018-07-13 ENCOUNTER — Encounter: Payer: Self-pay | Admitting: Obstetrics and Gynecology

## 2018-07-13 DIAGNOSIS — B1920 Unspecified viral hepatitis C without hepatic coma: Secondary | ICD-10-CM | POA: Insufficient documentation

## 2018-07-14 ENCOUNTER — Telehealth: Payer: Self-pay | Admitting: Clinical

## 2018-07-14 NOTE — Telephone Encounter (Signed)
Check in with pt, referred by Nolene Bernheim, NP; pt agrees to see [facility] at [facility] for [facility] at [facility] on 07/15/2018, either after ultrasound appointment, or after [facility] appointment same-day.

## 2018-07-15 ENCOUNTER — Encounter (HOSPITAL_COMMUNITY): Payer: Self-pay

## 2018-07-15 ENCOUNTER — Ambulatory Visit (INDEPENDENT_AMBULATORY_CARE_PROVIDER_SITE_OTHER): Payer: Medicaid Other | Admitting: Obstetrics and Gynecology

## 2018-07-15 ENCOUNTER — Ambulatory Visit (HOSPITAL_COMMUNITY)
Admission: RE | Admit: 2018-07-15 | Discharge: 2018-07-15 | Disposition: A | Discharge status: Home / Self Care | Payer: Medicaid Other | Source: Ambulatory Visit | Attending: Nurse Practitioner | Admitting: Nurse Practitioner

## 2018-07-15 ENCOUNTER — Encounter: Payer: Self-pay | Admitting: Obstetrics and Gynecology

## 2018-07-15 ENCOUNTER — Other Ambulatory Visit (HOSPITAL_COMMUNITY): Payer: Self-pay | Admitting: *Deleted

## 2018-07-15 VITALS — BP 110/71 | HR 76 | Wt 171.1 lb

## 2018-07-15 DIAGNOSIS — Z23 Encounter for immunization: Secondary | ICD-10-CM | POA: Diagnosis not present

## 2018-07-15 DIAGNOSIS — O34219 Maternal care for unspecified type scar from previous cesarean delivery: Secondary | ICD-10-CM

## 2018-07-15 DIAGNOSIS — O99323 Drug use complicating pregnancy, third trimester: Secondary | ICD-10-CM | POA: Insufficient documentation

## 2018-07-15 DIAGNOSIS — F1120 Opioid dependence, uncomplicated: Secondary | ICD-10-CM | POA: Diagnosis not present

## 2018-07-15 DIAGNOSIS — Z8759 Personal history of other complications of pregnancy, childbirth and the puerperium: Secondary | ICD-10-CM

## 2018-07-15 DIAGNOSIS — O0990 Supervision of high risk pregnancy, unspecified, unspecified trimester: Secondary | ICD-10-CM

## 2018-07-15 DIAGNOSIS — O99320 Drug use complicating pregnancy, unspecified trimester: Secondary | ICD-10-CM

## 2018-07-15 DIAGNOSIS — Z363 Encounter for antenatal screening for malformations: Secondary | ICD-10-CM | POA: Diagnosis present

## 2018-07-15 DIAGNOSIS — O98419 Viral hepatitis complicating pregnancy, unspecified trimester: Secondary | ICD-10-CM

## 2018-07-15 DIAGNOSIS — O98413 Viral hepatitis complicating pregnancy, third trimester: Secondary | ICD-10-CM | POA: Insufficient documentation

## 2018-07-15 DIAGNOSIS — Z3A33 33 weeks gestation of pregnancy: Secondary | ICD-10-CM | POA: Insufficient documentation

## 2018-07-15 DIAGNOSIS — B182 Chronic viral hepatitis C: Secondary | ICD-10-CM

## 2018-07-15 DIAGNOSIS — Z148 Genetic carrier of other disease: Secondary | ICD-10-CM | POA: Insufficient documentation

## 2018-07-15 DIAGNOSIS — O09293 Supervision of pregnancy with other poor reproductive or obstetric history, third trimester: Secondary | ICD-10-CM

## 2018-07-15 DIAGNOSIS — O0993 Supervision of high risk pregnancy, unspecified, third trimester: Secondary | ICD-10-CM

## 2018-07-15 NOTE — BH Specialist Note (Deleted)
Integrated Behavioral Health Initial Visit  MRN: [id number] Name: Rebekah Chavez  Number of Integrated Behavioral Health Clinician visits:: {IBH Number of Visits:21014052} Session Start time: ***  Session End time: *** Total time: {IBH Total Time:21014050}  Type of Service: Integrated Behavioral Health- Individual/Family Interpretor:{yes YT:[id number]} Interpretor Name and Language: ***   Warm Hand Off Completed.       SUBJECTIVE: Rebekah Chavez is a 32 y.o. female accompanied by {CHL AMB ACCOMPANIED PO:1410301314} Patient was referred by *** for ***. Patient reports the following symptoms/concerns: *** Duration of problem: ***; Severity of problem: {Mild/Moderate/Severe:20260}  OBJECTIVE: Mood: {BHH MOOD:22306} and Affect: {BHH AFFECT:22307} Risk of harm to self or others: {CHL AMB BH Suicide Current Mental Status:21022748}  LIFE CONTEXT: Family and Social: *** School/Work: *** Self-Care: *** Life Changes: ***  GOALS ADDRESSED: Patient will: 1. Reduce symptoms of: {IBH Symptoms:21014056} 2. Increase knowledge and/or ability of: {IBH Patient Tools:21014057}  3. Demonstrate ability to: {IBH Goals:21014053}  INTERVENTIONS: Interventions utilized: {IBH Interventions:21014054}  Standardized Assessments completed: {IBH Screening Tools:21014051}  ASSESSMENT: Patient currently experiencing ***.   Patient may benefit from ***.  PLAN: 1. Follow up with behavioral health clinician on : *** 2. Behavioral recommendations: *** 3. Referral(s): {IBH Referrals:21014055} 4. "From scale of 1-10, how likely are you to follow plan?": ***  Valetta Close Dametria Tuzzolino, LCSW

## 2018-07-15 NOTE — Progress Notes (Signed)
   PRENATAL VISIT NOTE  Subjective:  Rebekah Chavez is a 32 y.o. [phone] at [redacted]w[redacted]d being seen today for ongoing prenatal care.  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  Patient reports no complaints.  Contractions: Not present. Vag. Bleeding: None.  Movement: Present. Denies leaking of fluid.   The following portions of the patient's history were reviewed and updated as appropriate: allergies, current medications, past family history, past medical history, past social history, past surgical history and problem list. Problem list updated.  Objective:   Vitals:   07/15/18 1349  BP: 110/71  Pulse: 76  Weight: 171 lb 1.6 oz (77.6 kg)    Fetal Status: Fetal Heart Rate (bpm): 156 Fundal Height: 33 cm Movement: Present     General:  Alert, oriented and cooperative. Patient is in no acute distress.  Skin: Skin is warm and dry. No rash noted.   Cardiovascular: Normal heart rate noted  Respiratory: Normal respiratory effort, no problems with respiration noted  Abdomen: Soft, gravid, appropriate for gestational age.  Pain/Pressure: Absent     Pelvic: Cervical exam deferred        Extremities: Normal range of motion.  Edema: Trace  Mental Status: Normal mood and affect. Normal behavior. Normal judgment and thought content.   Assessment and Plan:  Pregnancy: [id number] at [redacted]w[redacted]d  1. Supervision of high risk pregnancy, antepartum Patient is doing well without complaints Patient desires to deliver in [place] as they have a couplet room for infants being observed  for NAS. Advised patient to seek Ob care in [place] to plan for delivery there. She may continue PNV in [place] if needed Patient is not interested in repeating glucola testing. She states that fasting was obtained but they were unable to collect 1 and 2 hour blood draws. CBGs were obtained instead and patient reports them as normal A1c next visit  2. Substance abuse affecting pregnancy, antepartum Continue methadone  3. Previous cesarean section complicating pregnancy, antepartum condition or complication Patient scheduled for repeat Patient undecided on BTL but Medicaid form signed  Preterm labor symptoms and general obstetric precautions including but not limited to vaginal bleeding, contractions, leaking of fluid and fetal movement were reviewed in detail with the patient. Please refer to After Visit Summary for other counseling recommendations.  Return in about 2 weeks (around 07/29/2018) for ROB.  Future Appointments  Date Time Provider Department Center  07/29/2018 11:15 AM Sheryl Saintil, Gigi Gin, MD [facility] None  08/12/2018  3:00 PM [facility] [place] 3 [facility]    Catalina Antigua, MD

## 2018-07-29 ENCOUNTER — Encounter: Payer: Medicaid Other | Admitting: Obstetrics and Gynecology

## 2018-08-03 ENCOUNTER — Telehealth: Payer: Self-pay | Admitting: *Deleted

## 2018-08-03 NOTE — Telephone Encounter (Signed)
Attempted to call patient to reschedule missed Ob appt and recording stated # is unavaliablile and can not be reached.Marland Kitchen..Marland Kitchen

## 2018-08-03 NOTE — Telephone Encounter (Signed)
error 

## 2018-08-06 ENCOUNTER — Telehealth (HOSPITAL_COMMUNITY): Payer: Self-pay | Admitting: *Deleted

## 2018-08-06 NOTE — Telephone Encounter (Signed)
Preadmission screen  

## 2018-08-10 ENCOUNTER — Telehealth (HOSPITAL_COMMUNITY): Payer: Self-pay | Admitting: *Deleted

## 2018-08-10 NOTE — Telephone Encounter (Signed)
Preadmission screen  

## 2018-08-12 ENCOUNTER — Encounter: Payer: Self-pay | Admitting: Obstetrics and Gynecology

## 2018-08-12 ENCOUNTER — Encounter (HOSPITAL_COMMUNITY): Payer: Self-pay

## 2018-08-12 ENCOUNTER — Telehealth: Payer: Self-pay | Admitting: Obstetrics and Gynecology

## 2018-08-12 ENCOUNTER — Other Ambulatory Visit (HOSPITAL_COMMUNITY)
Admission: RE | Admit: 2018-08-12 | Discharge: 2018-08-12 | Disposition: A | Discharge status: Home / Self Care | Payer: Medicaid Other | Source: Ambulatory Visit | Attending: Obstetrics and Gynecology | Admitting: Obstetrics and Gynecology

## 2018-08-12 ENCOUNTER — Ambulatory Visit (INDEPENDENT_AMBULATORY_CARE_PROVIDER_SITE_OTHER): Payer: Medicaid Other | Admitting: Obstetrics and Gynecology

## 2018-08-12 ENCOUNTER — Ambulatory Visit (HOSPITAL_COMMUNITY)
Admission: RE | Admit: 2018-08-12 | Discharge: 2018-08-12 | Disposition: A | Discharge status: Home / Self Care | Payer: Medicaid Other | Source: Ambulatory Visit | Attending: Nurse Practitioner | Admitting: Nurse Practitioner

## 2018-08-12 VITALS — BP 126/75 | HR 66 | Wt 172.5 lb

## 2018-08-12 DIAGNOSIS — Z3A37 37 weeks gestation of pregnancy: Secondary | ICD-10-CM | POA: Insufficient documentation

## 2018-08-12 DIAGNOSIS — O99323 Drug use complicating pregnancy, third trimester: Secondary | ICD-10-CM | POA: Diagnosis not present

## 2018-08-12 DIAGNOSIS — O34219 Maternal care for unspecified type scar from previous cesarean delivery: Secondary | ICD-10-CM

## 2018-08-12 DIAGNOSIS — O98419 Viral hepatitis complicating pregnancy, unspecified trimester: Secondary | ICD-10-CM

## 2018-08-12 DIAGNOSIS — B182 Chronic viral hepatitis C: Secondary | ICD-10-CM

## 2018-08-12 DIAGNOSIS — O99320 Drug use complicating pregnancy, unspecified trimester: Secondary | ICD-10-CM

## 2018-08-12 DIAGNOSIS — L299 Pruritus, unspecified: Secondary | ICD-10-CM

## 2018-08-12 DIAGNOSIS — O0993 Supervision of high risk pregnancy, unspecified, third trimester: Secondary | ICD-10-CM | POA: Diagnosis not present

## 2018-08-12 DIAGNOSIS — O0990 Supervision of high risk pregnancy, unspecified, unspecified trimester: Secondary | ICD-10-CM

## 2018-08-12 DIAGNOSIS — Z1589 Genetic susceptibility to other disease: Secondary | ICD-10-CM

## 2018-08-12 DIAGNOSIS — O09213 Supervision of pregnancy with history of pre-term labor, third trimester: Secondary | ICD-10-CM

## 2018-08-12 DIAGNOSIS — O0933 Supervision of pregnancy with insufficient antenatal care, third trimester: Secondary | ICD-10-CM

## 2018-08-12 DIAGNOSIS — Z8759 Personal history of other complications of pregnancy, childbirth and the puerperium: Secondary | ICD-10-CM

## 2018-08-12 DIAGNOSIS — O09293 Supervision of pregnancy with other poor reproductive or obstetric history, third trimester: Secondary | ICD-10-CM

## 2018-08-12 LAB — COMPREHENSIVE METABOLIC PANEL
ALBUMIN: 2.6 g/dL — AB (ref 3.5–5.0)
ALK PHOS: 187 U/L — AB (ref 38–126)
ALT: 26 U/L (ref 0–44)
AST: 31 U/L (ref 15–41)
Anion gap: 7 (ref 5–15)
BILIRUBIN TOTAL: 0.5 mg/dL (ref 0.3–1.2)
BUN: 8 mg/dL (ref 6–20)
CALCIUM: 8.4 mg/dL — AB (ref 8.9–10.3)
CHLORIDE: 107 mmol/L (ref 98–111)
CO2: 23 mmol/L (ref 22–32)
CREATININE: 0.59 mg/dL (ref 0.44–1.00)
GFR calc Af Amer: >60 mL/min (ref >60)
GFR calc non Af Amer: >60 mL/min (ref >60)
Glucose, Bld: 90 mg/dL (ref 70–99)
Potassium: 3.7 mmol/L (ref 3.5–5.1)
SODIUM: 137 mmol/L (ref 135–145)
TOTAL PROTEIN: 6.5 g/dL (ref 6.5–8.1)

## 2018-08-12 LAB — CBC WITH DIFFERENTIAL/PLATELET
BASOS ABS: 0 10*3/uL (ref 0.0–0.1)
BASOS PCT: 0 %
Eosinophils Absolute: 0 10*3/uL (ref 0.0–0.7)
Eosinophils Relative: 1 %
HEMATOCRIT: 35.1 % — AB (ref 36.0–46.0)
Hemoglobin: 11.8 g/dL — ABNORMAL LOW (ref 12.0–15.0)
Lymphocytes Relative: 24 %
Lymphs Abs: 1.2 10*3/uL (ref 0.7–4.0)
MCH: 29.6 pg (ref 26.0–34.0)
MCHC: 33.6 g/dL (ref 30.0–36.0)
MCV: 88 fL (ref 78.0–100.0)
Monocytes Absolute: 0.2 10*3/uL (ref 0.1–1.0)
Monocytes Relative: 4 %
NEUTROS ABS: 3.5 10*3/uL (ref 1.7–7.7)
NEUTROS PCT: 71 %
Platelets: 153 10*3/uL (ref 150–400)
RBC: 3.99 MIL/uL (ref 3.87–5.11)
RDW: 13.3 % (ref 11.5–15.5)
WBC: 4.9 10*3/uL (ref 4.0–10.5)

## 2018-08-12 NOTE — Progress Notes (Signed)
   PRENATAL VISIT NOTE  Subjective:  Rebekah Chavez is a 32 y.o. [phone] at [redacted]w[redacted]d being seen today for ongoing prenatal care.  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  Patient reports severe itching that started a few weeks ago. The worst is on her feet and hands. Has not done anything for it except scratch it. .  Contractions: Not present. Vag. Bleeding: None.  Movement: Present. Denies leaking of fluid.   The following portions of the patient's history were reviewed and updated as appropriate: allergies, current medications, past family history, past medical history, past social history, past surgical history and problem list. Problem list updated.  Objective:   Vitals:   08/12/18 1319  BP: 126/75  Pulse: 66  Weight: 172 lb 8 oz (78.2 kg)    Fetal Status: Fetal Heart Rate (bpm): 142   Movement: Present     General:  Alert, oriented and cooperative. Patient is in no acute distress.  Skin: Skin is warm and dry. No rash noted.   Cardiovascular: Normal heart rate noted  Respiratory: Normal respiratory effort, no problems with respiration noted  Abdomen: Soft, gravid, appropriate for gestational age.  Pain/Pressure: Absent     Pelvic: Cervical exam deferred        Extremities: Normal range of motion.  Edema: Trace  Mental Status: Normal mood and affect. Normal behavior. Normal judgment and thought content.   Assessment and Plan:  Pregnancy: [id number] at [redacted]w[redacted]d  1. Supervision of high risk pregnancy, antepartum - Culture, beta strep (group b  only) - Cervicovaginal ancillary only  2. Previous cesarean section complicating pregnancy, antepartum condition or complication For RCS @ 39 weeks Does not want BTL  3. 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 Monoallelic mutation of SMN1 gene  5. Hepatitis C virus carrier state (HCC)  6. Itching Patient with multiple abrasions on skin Concern for cholestasis To MAU for eval, patient agreeable, will present  Term labor symptoms and general obstetric precautions including but not limited to vaginal bleeding, contractions, leaking of fluid and fetal movement were reviewed in detail with the patient. Please refer to After Visit Summary for other counseling recommendations.  Return in about 1 week (around 08/19/2018) for OB visit (MD).  Future Appointments  Date Time Provider Department Center  08/12/2018  3:00 PM WH-MFC [place] 3 WH-MFCUS MFC-US  08/19/2018 12:00 PM WH-SDCW PAT 5 WH-SDCW None    Conan Bowens, MD \

## 2018-08-12 NOTE — Progress Notes (Signed)
Pt is here for ROB. G4P1 [redacted]w[redacted]d. Pt complains of "itching all over"

## 2018-08-12 NOTE — Progress Notes (Addendum)
Patient came to MAU desk and refused to sign in for labs. Discussed with Dr. Earlene Plater. Patient with excoriated skin from itching. Bile acids will take 2 days to result, however if LFTs are elevated today then delivery may be indicated depending on in house attending. Will put in labs, and call to see if they can be drawn while patient is in MFM [place]. LFTs are normal today. I have not personally seen or examined this patient. However, DW Dr. Shawnie Pons, and will add patient to the labor list so that the labor and delivery team can call for her to come if needed.   Results for orders placed or performed during the hospital encounter of 08/12/18 (from the past 24 hour(s))  CBC with Differential/Platelet     Status: Abnormal   Collection Time: 08/12/18  3:33 PM  Result Value Ref Range   WBC 4.9 4.0 - 10.5 K/uL   RBC 3.99 3.87 - 5.11 MIL/uL   Hemoglobin 11.8 (L) 12.0 - 15.0 g/dL   HCT 08.6 (L) 57.8 - 46.9 %   MCV 88.0 78.0 - 100.0 fL   MCH 29.6 26.0 - 34.0 pg   MCHC 33.6 30.0 - 36.0 g/dL   RDW 62.9 52.8 - 41.3 %   Platelets 153 150 - 400 K/uL   Neutrophils Relative % 71 %   Neutro Abs 3.5 1.7 - 7.7 K/uL   Lymphocytes Relative 24 %   Lymphs Abs 1.2 0.7 - 4.0 K/uL   Monocytes Relative 4 %   Monocytes Absolute 0.2 0.1 - 1.0 K/uL   Eosinophils Relative 1 %   Eosinophils Absolute 0.0 0.0 - 0.7 K/uL   Basophils Relative 0 %   Basophils Absolute 0.0 0.0 - 0.1 K/uL  Comprehensive metabolic panel     Status: Abnormal   Collection Time: 08/12/18  3:33 PM  Result Value Ref Range   Sodium 137 135 - 145 mmol/L   Potassium 3.7 3.5 - 5.1 mmol/L   Chloride 107 98 - 111 mmol/L   CO2 23 22 - 32 mmol/L   Glucose, Bld 90 70 - 99 mg/dL   BUN 8 6 - 20 mg/dL   Creatinine, Ser 2.44 0.44 - 1.00 mg/dL   Calcium 8.4 (L) 8.9 - 10.3 mg/dL   Total Protein 6.5 6.5 - 8.1 g/dL   Albumin 2.6 (L) 3.5 - 5.0 g/dL   AST 31 15 - 41 U/L   ALT 26 0 - 44 U/L   Alkaline Phosphatase 187 (H) 38 - 126 U/L   Total Bilirubin 0.5 0.3 -  1.2 mg/dL   GFR calc non Af Amer >60 >60 mL/min   GFR calc Af Amer >60 >60 mL/min   Anion gap 7 5 - 15    Trent Theisen 3:03 PM 08/12/18

## 2018-08-12 NOTE — Telephone Encounter (Signed)
Pt states will be available at ph# [phone] TODAY.  Then her normal phone again tomorrow. PLEASE CALL [phone] TODAY for LAB Results from [facility].

## 2018-08-12 NOTE — Pre-Procedure Instructions (Signed)
Pt was here for Ultrasound.  Refused to stay to see MFM due to childcare.  I spoke to her in the Lab while blood was being drawn.  Declined to review medical history at present.  Explained PAT visit and requested she come on 8/10 for her visit.  She is still uncertain if she will be delivering here or at [facility].  Verbalized understanding and agreed to come for preop visit if delivering here.

## 2018-08-13 ENCOUNTER — Telehealth: Payer: Self-pay

## 2018-08-13 LAB — CERVICOVAGINAL ANCILLARY ONLY
Chlamydia: NEGATIVE
NEISSERIA GONORRHEA: NEGATIVE

## 2018-08-13 LAB — BILE ACIDS, TOTAL: BILE ACIDS TOTAL: 10.7 umol/L — AB (ref 0.0–10.0)

## 2018-08-13 NOTE — Telephone Encounter (Signed)
Bile acids back (10.7). Consult with Dr. Shawnie Pons, recommend RCS next week. Scheduled for 08/16/18 @0930 . Pt notified of elevated bile acids and plan for CS on Monday. Instructed to arrive at 0700. NPO after MN night before. Pt verbalizes understanding.

## 2018-08-13 NOTE — Telephone Encounter (Signed)
Returned call and advised that results are not back yet. 

## 2018-08-13 NOTE — Patient Instructions (Signed)
Rebekah Chavez  08/13/2018   Your procedure is scheduled on:  08/22/2018  Enter through the Main Entrance of [facility] at 0930 AM.  Pick up the phone at the desk and dial [phone]  Call this number if you have problems the morning of [phone]  Remember:   Do not eat food:(After Midnight) Desps de medianoche.  Do not drink clear liquids: (After Midnight) Desps de medianoche.  Take these medicines the morning of surgery with A SIP OF WATER: methadone   Do not wear jewelry, make-up or nail polish.  Do not wear lotions, powders, or perfumes. Do not wear deodorant.  Do not shave 48 hours prior to surgery.  Do not bring valuables to the hospital.  [facility] is not   responsible for any belongings or valuables brought to the hospital.  Contacts, dentures or bridgework may not be worn into surgery.  Leave suitcase in the car. After surgery it may be brought to your room.  For patients admitted to the hospital, checkout time is 11:00 AM the day of              discharge.    N/A   Please read over the following fact sheets that you were given:   Surgical Site Infection Prevention

## 2018-08-16 ENCOUNTER — Encounter (HOSPITAL_COMMUNITY)
Admission: RE | Disposition: A | Discharge status: Home / Self Care | Payer: Self-pay | Source: Home / Self Care | Attending: Obstetrics and Gynecology

## 2018-08-16 ENCOUNTER — Inpatient Hospital Stay (HOSPITAL_COMMUNITY): Payer: Medicaid Other | Admitting: Certified Registered Nurse Anesthetist

## 2018-08-16 ENCOUNTER — Inpatient Hospital Stay (HOSPITAL_COMMUNITY)
Admission: RE | Admit: 2018-08-16 | Discharge: 2018-08-19 | DRG: 786 | Disposition: A | Discharge status: Home / Self Care | Payer: Medicaid Other | Attending: Obstetrics and Gynecology | Admitting: Obstetrics and Gynecology

## 2018-08-16 ENCOUNTER — Encounter (HOSPITAL_COMMUNITY): Payer: Self-pay | Admitting: Obstetrics

## 2018-08-16 DIAGNOSIS — K831 Obstruction of bile duct: Secondary | ICD-10-CM | POA: Diagnosis present

## 2018-08-16 DIAGNOSIS — Z87891 Personal history of nicotine dependence: Secondary | ICD-10-CM

## 2018-08-16 DIAGNOSIS — B1920 Unspecified viral hepatitis C without hepatic coma: Secondary | ICD-10-CM | POA: Diagnosis present

## 2018-08-16 DIAGNOSIS — Z3A38 38 weeks gestation of pregnancy: Secondary | ICD-10-CM

## 2018-08-16 DIAGNOSIS — O9962 Diseases of the digestive system complicating childbirth: Secondary | ICD-10-CM

## 2018-08-16 DIAGNOSIS — F1110 Opioid abuse, uncomplicated: Secondary | ICD-10-CM | POA: Diagnosis present

## 2018-08-16 DIAGNOSIS — O26613 Liver and biliary tract disorders in pregnancy, third trimester: Secondary | ICD-10-CM

## 2018-08-16 DIAGNOSIS — O99324 Drug use complicating childbirth: Secondary | ICD-10-CM | POA: Diagnosis present

## 2018-08-16 DIAGNOSIS — O9081 Anemia of the puerperium: Secondary | ICD-10-CM | POA: Diagnosis present

## 2018-08-16 DIAGNOSIS — D62 Acute posthemorrhagic anemia: Secondary | ICD-10-CM | POA: Diagnosis present

## 2018-08-16 DIAGNOSIS — A599 Trichomoniasis, unspecified: Secondary | ICD-10-CM | POA: Diagnosis present

## 2018-08-16 DIAGNOSIS — O2662 Liver and biliary tract disorders in childbirth: Principal | ICD-10-CM | POA: Diagnosis present

## 2018-08-16 DIAGNOSIS — O26619 Liver and biliary tract disorders in pregnancy, unspecified trimester: Secondary | ICD-10-CM

## 2018-08-16 DIAGNOSIS — O34211 Maternal care for low transverse scar from previous cesarean delivery: Secondary | ICD-10-CM | POA: Diagnosis present

## 2018-08-16 DIAGNOSIS — O34219 Maternal care for unspecified type scar from previous cesarean delivery: Secondary | ICD-10-CM

## 2018-08-16 DIAGNOSIS — O0930 Supervision of pregnancy with insufficient antenatal care, unspecified trimester: Secondary | ICD-10-CM

## 2018-08-16 DIAGNOSIS — O0990 Supervision of high risk pregnancy, unspecified, unspecified trimester: Secondary | ICD-10-CM

## 2018-08-16 DIAGNOSIS — O99320 Drug use complicating pregnancy, unspecified trimester: Secondary | ICD-10-CM | POA: Diagnosis present

## 2018-08-16 DIAGNOSIS — O26649 Intrahepatic cholestasis of pregnancy, unspecified trimester: Secondary | ICD-10-CM | POA: Diagnosis present

## 2018-08-16 DIAGNOSIS — O26643 Intrahepatic cholestasis of pregnancy, third trimester: Secondary | ICD-10-CM | POA: Diagnosis present

## 2018-08-16 DIAGNOSIS — G129 Spinal muscular atrophy, unspecified: Secondary | ICD-10-CM | POA: Diagnosis present

## 2018-08-16 DIAGNOSIS — Z98891 History of uterine scar from previous surgery: Secondary | ICD-10-CM

## 2018-08-16 LAB — CBC
HEMATOCRIT: 35.4 % — AB (ref 36.0–46.0)
Hemoglobin: 11.8 g/dL — ABNORMAL LOW (ref 12.0–15.0)
MCH: 29.6 pg (ref 26.0–34.0)
MCHC: 33.3 g/dL (ref 30.0–36.0)
MCV: 88.7 fL (ref 78.0–100.0)
Platelets: 160 10*3/uL (ref 150–400)
RBC: 3.99 MIL/uL (ref 3.87–5.11)
RDW: 13.2 % (ref 11.5–15.5)
WBC: 7.9 10*3/uL (ref 4.0–10.5)

## 2018-08-16 LAB — ABO/RH: ABO/RH(D): O POS

## 2018-08-16 LAB — RPR: RPR Ser Ql: NONREACTIVE

## 2018-08-16 SURGERY — Surgical Case
Anesthesia: Spinal | Site: Abdomen | Wound class: Clean Contaminated

## 2018-08-16 MED ORDER — CEFAZOLIN SODIUM-DEXTROSE 2-4 GM/100ML-% IV SOLN
2.0000 g | INTRAVENOUS | Status: AC
  Administered 2018-08-16: 2 g via INTRAVENOUS
  Filled 2018-08-16: qty 100

## 2018-08-16 MED ORDER — SOD CITRATE-CITRIC ACID 500-334 MG/5ML PO SOLN
30.0000 mL | ORAL | Status: AC
  Administered 2018-08-16: 30 mL via ORAL
  Filled 2018-08-16: qty 15

## 2018-08-16 MED ORDER — DIPHENHYDRAMINE HCL 50 MG/ML IJ SOLN: 12.5000 mg | Freq: Four times a day (QID) | INTRAMUSCULAR | Status: DC | PRN

## 2018-08-16 MED ORDER — SIMETHICONE 80 MG PO CHEW: 80.0000 mg | CHEWABLE_TABLET | ORAL | Status: DC | PRN

## 2018-08-16 MED ORDER — KETAMINE HCL 10 MG/ML IJ SOLN
INTRAMUSCULAR | Status: DC | PRN
  Administered 2018-08-16 (×2): 10 mg via INTRAVENOUS

## 2018-08-16 MED ORDER — HYDROMORPHONE HCL 1 MG/ML IJ SOLN
INTRAMUSCULAR | Status: AC
  Filled 2018-08-16: qty 1

## 2018-08-16 MED ORDER — LACTATED RINGERS IV SOLN: INTRAVENOUS | Status: DC

## 2018-08-16 MED ORDER — DIPHENHYDRAMINE HCL 12.5 MG/5ML PO ELIX
12.5000 mg | ORAL_SOLUTION | Freq: Four times a day (QID) | ORAL | Status: DC | PRN
  Filled 2018-08-16: qty 5

## 2018-08-16 MED ORDER — ONDANSETRON HCL 4 MG/2ML IJ SOLN: 4.0000 mg | Freq: Four times a day (QID) | INTRAMUSCULAR | Status: DC | PRN

## 2018-08-16 MED ORDER — SCOPOLAMINE 1 MG/3DAYS TD PT72
MEDICATED_PATCH | TRANSDERMAL | Status: AC
  Administered 2018-08-16: 1.5 mg via TRANSDERMAL
  Filled 2018-08-16: qty 1

## 2018-08-16 MED ORDER — OXYCODONE HCL 5 MG PO TABS
10.0000 mg | ORAL_TABLET | ORAL | Status: DC | PRN
  Administered 2018-08-17 – 2018-08-18 (×6): 10 mg via ORAL
  Filled 2018-08-16 (×7): qty 2

## 2018-08-16 MED ORDER — GLYCOPYRROLATE 0.2 MG/ML IJ SOLN
INTRAMUSCULAR | Status: AC
  Filled 2018-08-16: qty 1

## 2018-08-16 MED ORDER — SCOPOLAMINE 1 MG/3DAYS TD PT72
1.0000 | MEDICATED_PATCH | TRANSDERMAL | Status: DC
  Administered 2018-08-16: 1.5 mg via TRANSDERMAL

## 2018-08-16 MED ORDER — MIDAZOLAM HCL 5 MG/5ML IJ SOLN
INTRAMUSCULAR | Status: DC | PRN
  Administered 2018-08-16 (×2): 1 mg via INTRAVENOUS

## 2018-08-16 MED ORDER — BUPIVACAINE HCL (PF) 0.5 % IJ SOLN
INTRAMUSCULAR | Status: AC
  Filled 2018-08-16: qty 30

## 2018-08-16 MED ORDER — HYDROMORPHONE HCL 1 MG/ML IJ SOLN
INTRAMUSCULAR | Status: DC | PRN
  Administered 2018-08-16 (×2): 0.5 mg via INTRAVENOUS

## 2018-08-16 MED ORDER — KETAMINE HCL 10 MG/ML IJ SOLN
INTRAMUSCULAR | Status: AC
  Filled 2018-08-16: qty 1

## 2018-08-16 MED ORDER — PHENYLEPHRINE 8 MG IN D5W 100 ML (0.08MG/ML) PREMIX OPTIME
INJECTION | INTRAVENOUS | Status: DC | PRN
  Administered 2018-08-16: 60 ug/min via INTRAVENOUS

## 2018-08-16 MED ORDER — LACTATED RINGERS IV SOLN
INTRAVENOUS | Status: DC | PRN
  Administered 2018-08-16: 10:00:00 via INTRAVENOUS

## 2018-08-16 MED ORDER — GLYCOPYRROLATE 0.2 MG/ML IJ SOLN
INTRAMUSCULAR | Status: DC | PRN
  Administered 2018-08-16: 0.2 mg via INTRAVENOUS

## 2018-08-16 MED ORDER — OXYTOCIN 10 UNIT/ML IJ SOLN
INTRAMUSCULAR | Status: AC
  Filled 2018-08-16: qty 4

## 2018-08-16 MED ORDER — SIMETHICONE 80 MG PO CHEW
80.0000 mg | CHEWABLE_TABLET | ORAL | Status: DC
  Administered 2018-08-16 – 2018-08-19 (×3): 80 mg via ORAL
  Filled 2018-08-16 (×3): qty 1

## 2018-08-16 MED ORDER — BUPIVACAINE IN DEXTROSE 0.75-8.25 % IT SOLN
INTRATHECAL | Status: DC | PRN
  Administered 2018-08-16: 1.5 mL via INTRATHECAL

## 2018-08-16 MED ORDER — ACETAMINOPHEN 325 MG PO TABS
650.0000 mg | ORAL_TABLET | ORAL | Status: DC | PRN
  Administered 2018-08-17 – 2018-08-18 (×6): 650 mg via ORAL
  Filled 2018-08-16 (×7): qty 2

## 2018-08-16 MED ORDER — MENTHOL 3 MG MT LOZG: 1.0000 | LOZENGE | OROMUCOSAL | Status: DC | PRN

## 2018-08-16 MED ORDER — BUPIVACAINE HCL (PF) 0.5 % IJ SOLN
INTRAMUSCULAR | Status: DC | PRN
  Administered 2018-08-16: 30 mL

## 2018-08-16 MED ORDER — IBUPROFEN 600 MG PO TABS
600.0000 mg | ORAL_TABLET | Freq: Four times a day (QID) | ORAL | Status: DC
  Administered 2018-08-16 – 2018-08-19 (×11): 600 mg via ORAL
  Filled 2018-08-16 (×11): qty 1

## 2018-08-16 MED ORDER — MIDAZOLAM HCL 2 MG/2ML IJ SOLN
INTRAMUSCULAR | Status: AC
  Filled 2018-08-16: qty 2

## 2018-08-16 MED ORDER — OXYTOCIN 10 UNIT/ML IJ SOLN
INTRAVENOUS | Status: DC | PRN
  Administered 2018-08-16: 40 [IU] via INTRAVENOUS

## 2018-08-16 MED ORDER — LACTATED RINGERS IV SOLN
INTRAVENOUS | Status: DC
  Administered 2018-08-16 (×2): via INTRAVENOUS

## 2018-08-16 MED ORDER — ENOXAPARIN SODIUM 40 MG/0.4ML ~~LOC~~ SOLN
40.0000 mg | SUBCUTANEOUS | Status: DC
  Filled 2018-08-16 (×3): qty 0.4

## 2018-08-16 MED ORDER — MAGNESIUM HYDROXIDE 400 MG/5ML PO SUSP: 30.0000 mL | ORAL | Status: DC | PRN

## 2018-08-16 MED ORDER — HYDROMORPHONE 1 MG/ML IV SOLN
INTRAVENOUS | Status: DC
  Administered 2018-08-16: 2.1 mg via INTRAVENOUS
  Administered 2018-08-16: 30 mg via INTRAVENOUS
  Administered 2018-08-16: 1.8 mg via INTRAVENOUS
  Administered 2018-08-17: 16 mg via INTRAVENOUS
  Filled 2018-08-16: qty 25

## 2018-08-16 MED ORDER — OXYCODONE HCL 5 MG PO TABS
5.0000 mg | ORAL_TABLET | ORAL | Status: DC | PRN
  Filled 2018-08-16: qty 1

## 2018-08-16 MED ORDER — WITCH HAZEL-GLYCERIN EX PADS: 1.0000 "application " | MEDICATED_PAD | CUTANEOUS | Status: DC | PRN

## 2018-08-16 MED ORDER — METOCLOPRAMIDE HCL 5 MG/ML IJ SOLN: 10.0000 mg | Freq: Once | INTRAMUSCULAR | Status: DC | PRN

## 2018-08-16 MED ORDER — HYDROMORPHONE HCL 1 MG/ML IJ SOLN
1.0000 mg | INTRAMUSCULAR | Status: DC | PRN
  Administered 2018-08-16 (×7): 1 mg via INTRAVENOUS

## 2018-08-16 MED ORDER — SODIUM CHLORIDE 0.9 % IR SOLN
Status: DC | PRN
  Administered 2018-08-16: 200 mL

## 2018-08-16 MED ORDER — DEXAMETHASONE SODIUM PHOSPHATE 4 MG/ML IJ SOLN
INTRAMUSCULAR | Status: AC
  Filled 2018-08-16: qty 1

## 2018-08-16 MED ORDER — MEPERIDINE HCL 25 MG/ML IJ SOLN: 6.2500 mg | INTRAMUSCULAR | Status: DC | PRN

## 2018-08-16 MED ORDER — NALOXONE HCL 0.4 MG/ML IJ SOLN: 0.4000 mg | INTRAMUSCULAR | Status: DC | PRN

## 2018-08-16 MED ORDER — SCOPOLAMINE 1 MG/3DAYS TD PT72: 1.0000 | MEDICATED_PATCH | Freq: Once | TRANSDERMAL | Status: DC

## 2018-08-16 MED ORDER — ONDANSETRON HCL 4 MG/2ML IJ SOLN
INTRAMUSCULAR | Status: DC | PRN
  Administered 2018-08-16: 4 mg via INTRAVENOUS

## 2018-08-16 MED ORDER — METHADONE HCL 10 MG/ML PO CONC
80.0000 mg | Freq: Every day | ORAL | Status: DC
  Administered 2018-08-17 – 2018-08-19 (×3): 80 mg via ORAL
  Filled 2018-08-16 (×5): qty 8

## 2018-08-16 MED ORDER — COCONUT OIL OIL: 1.0000 "application " | TOPICAL_OIL | Status: DC | PRN

## 2018-08-16 MED ORDER — ZOLPIDEM TARTRATE 5 MG PO TABS: 5.0000 mg | ORAL_TABLET | Freq: Every evening | ORAL | Status: DC | PRN

## 2018-08-16 MED ORDER — DIBUCAINE 1 % RE OINT: 1.0000 "application " | TOPICAL_OINTMENT | RECTAL | Status: DC | PRN

## 2018-08-16 MED ORDER — OXYTOCIN 40 UNITS IN LACTATED RINGERS INFUSION - SIMPLE MED: 2.5000 [IU]/h | INTRAVENOUS | Status: DC

## 2018-08-16 MED ORDER — SENNOSIDES-DOCUSATE SODIUM 8.6-50 MG PO TABS
2.0000 | ORAL_TABLET | ORAL | Status: DC
  Administered 2018-08-16 – 2018-08-19 (×3): 2 via ORAL
  Filled 2018-08-16 (×4): qty 2

## 2018-08-16 MED ORDER — ONDANSETRON HCL 4 MG/2ML IJ SOLN
INTRAMUSCULAR | Status: AC
  Filled 2018-08-16: qty 2

## 2018-08-16 MED ORDER — DIPHENHYDRAMINE HCL 25 MG PO CAPS: 25.0000 mg | ORAL_CAPSULE | Freq: Four times a day (QID) | ORAL | Status: DC | PRN

## 2018-08-16 MED ORDER — SIMETHICONE 80 MG PO CHEW
80.0000 mg | CHEWABLE_TABLET | Freq: Three times a day (TID) | ORAL | Status: DC
  Administered 2018-08-17 – 2018-08-19 (×6): 80 mg via ORAL
  Filled 2018-08-16 (×7): qty 1

## 2018-08-16 MED ORDER — LACTATED RINGERS IV SOLN
INTRAVENOUS | Status: DC
  Administered 2018-08-16 – 2018-08-17 (×2): via INTRAVENOUS

## 2018-08-16 MED ORDER — SODIUM CHLORIDE 0.9% FLUSH: 9.0000 mL | INTRAVENOUS | Status: DC | PRN

## 2018-08-16 MED ORDER — PRENATAL MULTIVITAMIN CH
1.0000 | ORAL_TABLET | Freq: Every day | ORAL | Status: DC
  Administered 2018-08-17 – 2018-08-19 (×3): 1 via ORAL
  Filled 2018-08-16 (×3): qty 1

## 2018-08-16 SURGICAL SUPPLY — 37 items
BENZOIN TINCTURE PRP APPL 2/3 (GAUZE/BANDAGES/DRESSINGS) ×3 IMPLANT
CHLORAPREP W/TINT 26ML (MISCELLANEOUS) ×3 IMPLANT
CLAMP CORD UMBIL (MISCELLANEOUS) IMPLANT
CLOTH BEACON ORANGE TIMEOUT ST (SAFETY) ×3 IMPLANT
DRSG OPSITE POSTOP 4X10 (GAUZE/BANDAGES/DRESSINGS) ×3 IMPLANT
ELECT REM PT RETURN 9FT ADLT (ELECTROSURGICAL) ×3
ELECTRODE REM PT RTRN 9FT ADLT (ELECTROSURGICAL) ×2 IMPLANT
EXTRACTOR VACUUM BELL STYLE (SUCTIONS) IMPLANT
GAUZE SPONGE 4X4 12PLY STRL LF (GAUZE/BANDAGES/DRESSINGS) ×6 IMPLANT
GLOVE BIOGEL PI IND STRL 6.5 (GLOVE) ×2 IMPLANT
GLOVE BIOGEL PI IND STRL 7.0 (GLOVE) ×4 IMPLANT
GLOVE BIOGEL PI INDICATOR 6.5 (GLOVE) ×1
GLOVE BIOGEL PI INDICATOR 7.0 (GLOVE) ×2
GLOVE ORTHOPEDIC STR SZ6.5 (GLOVE) ×3 IMPLANT
GOWN STRL REUS W/TWL LRG LVL3 (GOWN DISPOSABLE) ×9 IMPLANT
HEMOSTAT ARISTA ABSORB 3G PWDR (MISCELLANEOUS) ×3 IMPLANT
KIT ABG SYR 3ML LUER SLIP (SYRINGE) IMPLANT
NEEDLE HYPO 22GX1.5 SAFETY (NEEDLE) ×3 IMPLANT
NEEDLE HYPO 25X1 1.5 SAFETY (NEEDLE) IMPLANT
NS IRRIG 1000ML POUR BTL (IV SOLUTION) ×3 IMPLANT
PACK C SECTION WH (CUSTOM PROCEDURE TRAY) ×3 IMPLANT
PAD ABD 7.5X8 STRL (GAUZE/BANDAGES/DRESSINGS) ×3 IMPLANT
PAD OB MATERNITY 4.3X12.25 (PERSONAL CARE ITEMS) ×3 IMPLANT
PENCIL SMOKE EVAC W/HOLSTER (ELECTROSURGICAL) IMPLANT
SPONGE LAP 18X18 RF (DISPOSABLE) ×9 IMPLANT
STRIP CLOSURE SKIN 1/2X4 (GAUZE/BANDAGES/DRESSINGS) IMPLANT
SUT MON AB 4-0 PS1 27 (SUTURE) ×3 IMPLANT
SUT PLAIN 0 NONE (SUTURE) IMPLANT
SUT PLAIN 2 0 (SUTURE) ×1
SUT PLAIN ABS 2-0 CT1 27XMFL (SUTURE) ×2 IMPLANT
SUT PROLENE 1 CT (SUTURE) ×3 IMPLANT
SUT VIC AB 0 CT1 36 (SUTURE) ×6 IMPLANT
SUT VIC AB 0 CTX 36 (SUTURE) ×1
SUT VIC AB 0 CTX36XBRD ANBCTRL (SUTURE) ×2 IMPLANT
SYR CONTROL 10ML LL (SYRINGE) ×3 IMPLANT
TOWEL OR 17X24 6PK STRL BLUE (TOWEL DISPOSABLE) ×3 IMPLANT
TRAY FOLEY W/BAG SLVR 14FR LF (SET/KITS/TRAYS/PACK) ×3 IMPLANT

## 2018-08-16 NOTE — Op Note (Signed)
Rebekah Chavez PROCEDURE DATE: 08/16/2018  PREOPERATIVE DIAGNOSES: Intrauterine pregnancy at [redacted]w[redacted]d weeks gestation; elective repeat c-section, prior uterine incision, cholestasis of pregnancy  POSTOPERATIVE DIAGNOSES: The same  PROCEDURE: Repeat Low Transverse Cesarean Section  SURGEON:  Baldemar Lenis, MD  ASSISTANT:  none  ANESTHESIOLOGY TEAM: Anesthesiologist: Phillips Grout, MD CRNA: Lenox Ahr, CRNA  INDICATIONS: Rebekah Chavez is a 32 y.o. [id number] at [redacted]w[redacted]d here for cesarean section secondary to the indications listed under preoperative diagnoses; please see preoperative note for further details.  The risks of cesarean section were discussed with the patient including but were not limited to: bleeding which may require transfusion or reoperation; infection which may require antibiotics; injury to bowel, bladder, ureters or other surrounding organs; injury to the fetus; need for additional procedures including hysterectomy in the event of a life-threatening hemorrhage; placental abnormalities wth subsequent pregnancies, incisional problems, thromboembolic phenomenon and other postoperative/anesthesia complications.  She consents to blood transfusion in the event of an emergency. The patient verbalized understanding of the plan, giving informed written consent for the procedure.    FINDINGS:  Viable female infant in cephalic presentation.  Apgars 8 and 9.  Clear amniotic fluid.  Intact placenta, three vessel cord. Rectus muscle difficult to visualize in midline, appeared to mostly be scar tissue with rectus muscle visualized laterally. Normal uterus with placenta tacked onto lower uterine segment, fallopian tubes and ovaries bilaterally.  ANESTHESIA: Spinal  INTRAVENOUS FLUIDS: 1600 ml   ESTIMATED BLOOD LOSS: 542 ml URINE OUTPUT:  150 ml SPECIMENS: Placenta sent to pathology COMPLICATIONS: None immediate  PROCEDURE IN DETAIL:  The patient preoperatively received intravenous  antibiotics and had sequential compression devices applied to her lower extremities.  She was then taken to the operating room where spinal anesthesia was administered and was found to be adequate. She was then placed in a dorsal supine position with a leftward tilt, and prepped and draped in a sterile manner.  A foley catheter was placed into her bladder with sterile technique and attached to constant gravity.  After a timeout was performed, a Pfannenstiel skin incision was made with scalpel over her preexisting scar and carried through to the underlying layer of fascia. The muscle layer was non-existent in the midline but visible bilaterally, only scar tissue was present in midline. The fascia was incised in the midline, and this incision was extended bilaterally using the Mayo scissors.  Kocher clamps were applied to the superior aspect of the fascial incision and the underlying rectus muscles were dissected off sharply. Fascia was grossly densely adhered to rectus muscle.  A similar process was carried out on the inferior aspect of the fascial incision. The rectus muscles were separated in the midline sharply and the peritoneum  was entered sharply. The peritoneal incision was carefully extended sharply laterally and caudad with good visualization of the bladder. The rectus muscle was transected on the left side for additional visualization.The uterus appeared normal. An Alexis retractor was placed into the incision for additional visualization. The bladder blade was inserted. Attention was turned to the lower uterine segment where a bladder flap was made and the bladder was gently dissected off the lower uterine segment. Next, a low transverse hysterotomy was made with a scalpel and extended bilaterally bluntly.  The infant was delivered from OA position, nose and mouth were bulb suctioned, and the cord clamped and cut after 1 minute. The infant was then handed over to the waiting neonatology team. Uterine  massage was then performed, and the placenta  delivered intact with a three-vessel cord. The uterus was then cleared of clots and debris.  The hysterotomy was closed with 0 Vicryl in a running locked fashion, and a second layer was also placed with 0 Vicryl.  The fallopian tubes and ovaries were visualized bilaterally and normal appearing.    The pelvis was cleared of all clot and debris. Arista was placed over the hysterotomy and where the bladder was dissected and some oozing noted for additional hemostasis.  The muscle was carefully examined and several oozing spots were noted and cauterized carefully using the Bovie. Arista placed around the rectus muscle where it had been transected. After careful inspection, hemostasis was again confirmed on all surfaces. The fascia was then closed using 0 Vicryl in a running fashion.  The subcutaneous layer was irrigated, then reapproximated with 2-0 plain gut, and 30 ml of 0.5% Marcaine was injected subcutaneously around the incision.  The skin was closed with a 4-0 Monocryl subcuticular stitch. The patient tolerated the procedure well. Sponge, lap, instrument and needle counts were correct x 3.  She was taken to the recovery room in stable condition.    Baldemar Lenis, M.D. Attending Obstetrician & Gynecologist, [facility] for [employer], [facility]

## 2018-08-16 NOTE — Lactation Note (Addendum)
This note was copied from a baby's chart. Lactation Consultation Note  Patient Name: Rebekah Chavez [id number] Date: 08/16/2018 Reason for consult: Initial assessment;Early term 37-38.6wks;Infant < 6lbs Mom on methadone. Baby 4 hours. Initial visit attempted but mom could not keep her eyes open and falling asleep.  Family member reported that baby just fed from left breast x 10 minutes.  Discussed importance of feeding with cues but at least every 3 hours.  Instructed to call for assist prn.  Will follow up later in day.  Maternal Data Does the patient have breastfeeding experience prior to this delivery?: No  Feeding Feeding Type: Breast Milk  LATCH Score Latch: Repeated attempts needed to sustain latch, nipple held in mouth throughout feeding, stimulation needed to elicit sucking reflex.  Audible Swallowing: None  Type of Nipple: Everted at rest and after stimulation  Comfort (Breast/Nipple): Soft / non-tender  Hold (Positioning): Assistance needed to correctly position infant at breast and maintain latch.  LATCH Score: 6  Interventions    Lactation Tools Discussed/Used     Consult Status Consult Status: Follow-up Date: 08/17/18 Follow-up type: In-patient    Huston Foley 08/16/2018, 2:40 PM

## 2018-08-16 NOTE — Lactation Note (Addendum)
This note was copied from a baby's chart. Lactation Consultation Note  Patient Name: Rebekah Chavez [id number] Date: 08/16/2018   Pecola Leisure will remain in hospital for 5 days, NAS baby.  To encourage Eat, Sleep, and Console.   Visited with P2 Mom of ET infant at 1 hrs old. Baby <6 lbs. Mom on PCA pump for extreme pain.  Baby has fed twice at the breast for short feedings, 3 and 8 mins.  Mom sleepy and not able to care for her baby currently, family left room.   RN took baby to CN and fed baby 20 ml of 24 cal formula.  Mom aware that baby needs to eat. Will continue to monitor.  Will set up DEBP once Mom becomes more alert and able to pump.     Judee Clara 08/16/2018, 6:51 PM

## 2018-08-16 NOTE — Anesthesia Preprocedure Evaluation (Signed)
Anesthesia Evaluation  Patient identified by MRN, date of birth, ID band Patient awake    Reviewed: Allergy & Precautions, NPO status , Patient's Chart, lab work & pertinent test results  Airway Mallampati: III  TM Distance: >3 FB Neck ROM: Full  Mouth opening: Limited Mouth Opening  Dental no notable dental hx. (+) Edentulous Upper, Edentulous Lower   Pulmonary neg pulmonary ROS, former smoker,    Pulmonary exam normal breath sounds clear to auscultation       Cardiovascular negative cardio ROS Normal cardiovascular exam Rhythm:Regular Rate:Normal     Neuro/Psych negative neurological ROS  negative psych ROS   GI/Hepatic negative GI ROS, (+)     substance abuse (chronic opioids on methadone 80mg  qd)  , Hepatitis -, C  Endo/Other  negative endocrine ROS  Renal/GU negative Renal ROS  negative genitourinary   Musculoskeletal negative musculoskeletal ROS (+)   Abdominal   Peds negative pediatric ROS (+)  Hematology negative hematology ROS (+)   Anesthesia Other Findings Cutaneous Lupus  H/o extensive facial reconstructive surgery with previous tracheostomy  Reproductive/Obstetrics negative OB ROS                             Anesthesia Physical Anesthesia Plan  ASA: II  Anesthesia Plan: Spinal   Post-op Pain Management:    Induction:   PONV Risk Score and Plan: 4 or greater and Scopolamine patch - Pre-op, Ondansetron and Treatment may vary due to age or medical condition  Airway Management Planned: Natural Airway  Additional Equipment:   Intra-op Plan:   Post-operative Plan:   Informed Consent: I have reviewed the patients History and Physical, chart, labs and discussed the procedure including the risks, benefits and alternatives for the proposed anesthesia with the patient or authorized representative who has indicated his/her understanding and acceptance.   Dental  advisory given  Plan Discussed with: CRNA  Anesthesia Plan Comments: (Chronic methadone. Not a candidate for intrathecal opioids. Will use IV opioids instead.)        Anesthesia Quick Evaluation

## 2018-08-16 NOTE — Transfer of Care (Signed)
Immediate Anesthesia Transfer of Care Note  Patient: Rebekah Chavez  Procedure(s) Performed: CESAREAN SECTION (N/A Abdomen)  Patient Location: PACU  Anesthesia Type:Spinal  Level of Consciousness: awake, alert  and oriented  Airway & Oxygen Therapy: Patient Spontanous Breathing and Patient connected to nasal cannula oxygen  Post-op Assessment: Report given to RN and Post -op Vital signs reviewed and stable  Post vital signs: Reviewed and stable  Last Vitals:  Vitals Value Taken Time  BP 144/96 08/16/2018 11:11 AM  Temp    Pulse 72 08/16/2018 11:13 AM  Resp 16 08/16/2018 11:13 AM  SpO2 100 % 08/16/2018 11:13 AM  Vitals shown include unvalidated device data.  Last Pain:  Vitals:   08/16/18 0821  TempSrc: Oral         Complications: No apparent anesthesia complications
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
## 2018-08-16 NOTE — Anesthesia Postprocedure Evaluation (Signed)
Anesthesia Post Note  Patient: Belenda Cruise  Procedure(s) Performed: CESAREAN SECTION (N/A Abdomen)     Patient location during evaluation: PACU Anesthesia Type: Spinal Level of consciousness: oriented and awake and alert Pain management: pain level controlled Vital Signs Assessment: post-procedure vital signs reviewed and stable Respiratory status: spontaneous breathing and respiratory function stable Cardiovascular status: blood pressure returned to baseline and stable Postop Assessment: no headache, no backache and no apparent nausea or vomiting Anesthetic complications: no    Last Vitals:  Vitals:   08/16/18 1800 08/16/18 1924  BP:    Pulse: 73   Resp: (!) 26 (!) 24  Temp:    SpO2: 98% 99%    Last Pain:  Vitals:   08/16/18 1924  TempSrc:   PainSc: 10-Worst pain ever   Pain Goal:                 Lowella Curb

## 2018-08-16 NOTE — Anesthesia Procedure Notes (Signed)
Spinal  Patient location during procedure: OR Staffing Anesthesiologist: Kyleeann Cremeans, MD Performed: anesthesiologist  Preanesthetic Checklist Completed: patient identified, site marked, surgical consent, pre-op evaluation, timeout performed, IV checked, risks and benefits discussed and monitors and equipment checked Spinal Block Patient position: sitting Prep: DuraPrep Patient monitoring: heart rate, continuous pulse ox and blood pressure Approach: midline Location: L3-4 Injection technique: single-shot Needle Needle type: Sprotte  Needle gauge: 24 G Needle length: 9 cm Additional Notes Expiration date of kit checked and confirmed. Patient tolerated procedure well, without complications.       

## 2018-08-17 LAB — CBC
HCT: 21.6 % — ABNORMAL LOW (ref 36.0–46.0)
HEMOGLOBIN: 7.5 g/dL — AB (ref 12.0–15.0)
MCH: 30 pg (ref 26.0–34.0)
MCHC: 34.7 g/dL (ref 30.0–36.0)
MCV: 86.4 fL (ref 80.0–100.0)
Platelets: 175 10*3/uL (ref 150–400)
RBC: 2.5 MIL/uL — ABNORMAL LOW (ref 3.87–5.11)
RDW: 13.4 % (ref 11.5–15.5)
WBC: 7.3 10*3/uL (ref 4.0–10.5)

## 2018-08-17 LAB — CREATININE, SERUM
CREATININE: 0.52 mg/dL (ref 0.44–1.00)
GFR calc Af Amer: >60 mL/min (ref >60)
GFR calc non Af Amer: >60 mL/min (ref >60)

## 2018-08-17 LAB — CULTURE, BETA STREP (GROUP B ONLY): STREP GP B CULTURE: NEGATIVE

## 2018-08-17 MED ORDER — FERROUS SULFATE 325 (65 FE) MG PO TABS
325.0000 mg | ORAL_TABLET | Freq: Two times a day (BID) | ORAL | Status: DC
  Administered 2018-08-17 – 2018-08-19 (×5): 325 mg via ORAL
  Filled 2018-08-17 (×5): qty 1

## 2018-08-17 MED ORDER — SODIUM CHLORIDE 0.9 % IV SOLN
510.0000 mg | Freq: Once | INTRAVENOUS | Status: AC
  Administered 2018-08-17: 510 mg via INTRAVENOUS
  Filled 2018-08-17: qty 17

## 2018-08-17 NOTE — Progress Notes (Signed)
PCA pump discontinued per order. There was no way to waste 16cc of dilaudid in pyxis. Deloris, Bonita Quin, [place]. Genia Del , Hurley Cisco RN all witnessed me wasting the 16cc of dilaudid. Pharmacist Deloris aware that I could not waste the PCA properly. Patient told the plan of care and all meds she can have for pain. Patient does not want to wear her SCDS I told her the risk factors. Patient up without dizzyness. Patient up in wheelchair to go outside. Negative flatus. Patient told to use incentive spirometer and walk halls today. Mom told to keep up with infant feedings. Dr. Earlene Plater aware of patient's status and was updated.

## 2018-08-17 NOTE — Lactation Note (Addendum)
This note was copied from a baby's chart. Lactation Consultation Note  Patient Name: Rebekah Chavez PIRJJ'O Date: 08/17/2018 Reason for consult: Follow-up assessment;Other (Comment)(NAS infant)  Mom has just been set up with a DEBP today, although she has not yet used it. I observed Mom doing hand expression, but I taught her a different technique that was able to yield more. Mom is now able to express colostrum easily. Mom has had bilateral nipple piercings twice. Athough those piercings have been removed for a couple of years, some of the holes are still patent. On Mom's L areola, Mom has a small scar. She says it has been there for a long time & believes it occurred from picking at pimples.   Mom does desire for infant to receive breast milk. Mom lactated for 6 weeks with her 1st child (who was born at 30-33 weeks). Mom reports having a good milk supply while pumping for her NICU infant.  Mom is currently eating lunch & infant recently had a full feeding. Supplementing at the breast may be an option for Mom, depending on infant's general latch ability.   Mom was encouraged to call lactation if she has any questions about pumping or to see about supplementing infant at breast later today. Mom knows that the expressed colostrum in the colostrum vial can be fed via spoon to infant by RN or LC.    To this LC's knowledge, Mom has not yet received anticipatory guidance about how to handle bleeding nipples (b/c of her Hep C+ status). I did not discuss with mother at this time (her mother-in-law & aunt were visiting).  Lurline Hare [facility] 08/17/2018, 2:24 PM

## 2018-08-17 NOTE — Progress Notes (Signed)
Postpartum Day 1: Cesarean Delivery  Subjective: Patient reports incisional pain, tolerating PO and no problems voiding.  No flatus yet. No signs/symptoms of anemia, ambulating without difficulty.  Objective: Vital signs in last 24 hours: Temp:  [98 F (36.7 C)-99.3 F (37.4 C)] 98.3 F (36.8 C) (10/08 0830) Pulse Rate:  [61-78] 78 (10/08 0830) Resp:  [15-26] 18 (10/08 0830) BP: (105-143)/(69-93) 123/80 (10/08 0830) SpO2:  [94 %-100 %] 98 % (10/08 0830)  Physical Exam:  General: alert and no distress Lochia: appropriate Uterine Fundus: firm Incision: dressing in place DVT Evaluation: No evidence of DVT seen on physical exam. Negative Homan's sign. No cords or calf tenderness. No significant calf/ankle edema.  Recent Labs    08/16/18 0820 08/17/18 0616  HGB 11.8* 7.5*  HCT 35.4* 21.6*    Assessment/Plan: Status post Cesarean section. Doing well postoperatively.  Feraheme ordered for anemia, will recheck tomorrow Bottlefeeding, Nexplanon for birth control Analgesia as needed. Continue current care.  Christee Mervine 08/17/2018, 11:51 AM

## 2018-08-17 NOTE — Progress Notes (Signed)
CSW acknowledges consult and completed clinical assessment.  Clinical documentation will follow.  There are barriers to d/c. CPS report made to [facility] CPS worker, Anderson Malta.  Blaine Hamper, MSW, LCSW Clinical Social Work [phone]

## 2018-08-17 NOTE — Lactation Note (Signed)
This note was copied from a baby's chart. Lactation Consultation Note Mom alone in rm. Spoke w/mom regarding Hep. C. Asked mom if she notices bleeding or cracked nipples to pump and dump, not to give baby that BM or colostrum d/t Hep. C. Mom stated she was aware of that. Asked mom if she has any questions please call for assistance.   Patient Name: Rebekah Chavez [id number] Date: 08/17/2018 Reason for consult: Follow-up assessment   Maternal Data    Feeding Feeding Type: Breast Milk  LATCH Score                   Interventions    Lactation Tools Discussed/Used     Consult Status Consult Status: Follow-up Date: 08/18/18 Follow-up type: In-patient    Charyl Dancer 08/17/2018, 9:47 PM

## 2018-08-18 DIAGNOSIS — D62 Acute posthemorrhagic anemia: Secondary | ICD-10-CM | POA: Diagnosis not present

## 2018-08-18 LAB — CBC
HCT: 19.9 % — ABNORMAL LOW (ref 36.0–46.0)
HCT: 23.8 % — ABNORMAL LOW (ref 36.0–46.0)
HEMOGLOBIN: 6.6 g/dL — AB (ref 12.0–15.0)
HEMOGLOBIN: 8.2 g/dL — AB (ref 12.0–15.0)
MCH: 29.1 pg (ref 26.0–34.0)
MCH: 30.8 pg (ref 26.0–34.0)
MCHC: 33.2 g/dL (ref 30.0–36.0)
MCHC: 34.5 g/dL (ref 30.0–36.0)
MCV: 87.7 fL (ref 80.0–100.0)
MCV: 89.5 fL (ref 80.0–100.0)
PLATELETS: 128 10*3/uL — AB (ref 150–400)
PLATELETS: 141 10*3/uL — AB (ref 150–400)
RBC: 2.27 MIL/uL — ABNORMAL LOW (ref 3.87–5.11)
RBC: 2.66 MIL/uL — ABNORMAL LOW (ref 3.87–5.11)
RDW: 13.7 % (ref 11.5–15.5)
RDW: 13.5 % (ref 11.5–15.5)
WBC: 4.5 10*3/uL (ref 4.0–10.5)
WBC: 5 10*3/uL (ref 4.0–10.5)
nRBC: 0 % (ref 0.0–0.2)
nRBC: 0 % (ref 0.0–0.2)

## 2018-08-18 LAB — PREPARE RBC (CROSSMATCH)

## 2018-08-18 MED ORDER — SODIUM CHLORIDE 0.9% IV SOLUTION
Freq: Once | INTRAVENOUS | Status: AC
  Administered 2018-08-18: 10:00:00 via INTRAVENOUS

## 2018-08-18 NOTE — Progress Notes (Signed)
Hemoglobin 6.6 at 0712 this am; Patient is asymptomatic. MD ordered and patient consented to blood transfusion. Patient received one unit and has declined the second unit; patient wants to go outside and doesn't want to wait. RN notified Dr. Ashok Pall; he will reassess need for further intervention when her CBC results this afternoon (ordered for 1500).

## 2018-08-18 NOTE — Lactation Note (Signed)
This note was copied from a baby's chart. Lactation Consultation Note  Patient Name: Girl Nadra Hritz OZHYQ'M Date: 08/18/2018 Reason for consult: Follow-up assessment;Difficult latch;Early term 42-38.6wks  P2 mother whose infant is now 105 hours old  Pecola Leisure is an NAS baby and will be in the hospital for 5 days.  Mother requested latch assistance  Baby sleeping when I arrived.  Mother awakened baby and changed diaper.  I did suck training and noted that her suck has improved since beginning to work with her at 1400 today.  Her suck is much more coordinated than earlier.  We are continuing to use the 5FR feeding tube at the breast for consistency with the feeding plan.  Assisted mother to latch onto the left breast in the football hold.  Baby began sucking and immediately heard a few swallows.  She still continues to be restless and will have to burp frequently but has improved since beginning this plan.  I continue to help teach mother and father how to feed so they can continue this throughout the night.  Mother stated, "I really want to do this."  Mother felt a good tug but denies pain with latching and sucking.  Many repeated latches needed but baby took 20 mls at the breast.  When she became too agitated we finished the feeding with an additional 10 mls with the curved tip syringe.  Mother burped baby.  I swaddled tightly and encouraged parents to continue swaddling tightly and allowing baby to rest between feedings.  Reminded mother that she will need to feed every 3 hours during the night and to be sure she feeds regularly to help prevent engorgement.  Mother has had engorgement in the past and remembers how painful this can be.  She pumps large volumes of milk after every feeding and knows milk storage times.  Father is present and willing to help mother during the night time.  Detailed feeding report given to RN so she can help mother continue this tonight.  I will also give the detailed plan to  night shift LC to help in the coordination of this care.  Mother will call for assistance as needed.   Maternal Data Formula Feeding for Exclusion: No Has patient been taught Hand Expression?: Yes Does the patient have breastfeeding experience prior to this delivery?: Yes  Feeding Feeding Type: Breast Fed  LATCH Score Latch: Repeated attempts needed to sustain latch, nipple held in mouth throughout feeding, stimulation needed to elicit sucking reflex.  Audible Swallowing: Spontaneous and intermittent(with 5 FR feeding tube)  Type of Nipple: Everted at rest and after stimulation  Comfort (Breast/Nipple): Soft / non-tender  Hold (Positioning): Assistance needed to correctly position infant at breast and maintain latch.  LATCH Score: 8  Interventions Interventions: Breast feeding basics reviewed;Assisted with latch;Skin to skin;Breast massage;Hand express;Position options;Expressed milk;Support pillows;Adjust position;Breast compression;DEBP  Lactation Tools Discussed/Used Initiated by:: already initiated   Consult Status Consult Status: Follow-up Date: 08/19/18 Follow-up type: In-patient    Dora Sims 08/18/2018, 9:57 PM

## 2018-08-18 NOTE — Progress Notes (Signed)
CSW left a message for [place] CPS regarding the status of [name]'s CPS report. CSW requested a return call.   Stiven Kaspar Boyd-Gilyard, MSW, LCSW Clinical Social Work [phone]  

## 2018-08-18 NOTE — Lactation Note (Signed)
This note was copied from a baby's chart. Lactation Consultation Note  Patient Name: Rebekah Chavez [id number] Date: 08/18/2018 Reason for consult: Follow-up assessment;Difficult latch;Early term 37-38.6wks;Infant < 6lbs  LC Follow Up Visit:  Mother requested latch assistance  Mother began to notice feeding cues and asked for lactation support.  We reviewed the feeding plan established at the last feed.    Attempted to latch to the right breast in the football hold.  Baby latched and after a couple of sucks became agitated. Since this was the exact behavior she displayed at her last feeding  I suggested a 5 FR feeding tube at the breast to help her associate EBM at the breast without having to struggle and get so agitated.  Mother agreed.  Baby latched with feeding tube in place and would, periodically, suck the EBM from the tube.  She remained more calm with this immediate gratification.  I continued to give her a ml of EBM at a time and pushed slowly to keep her calm.  She burped multiple times but did not have any large emesis with this feeding. She consumed a total of 23 mls of EBM in this manner and pulled off.   Since I did not want her to get agitated I helped mother use the curved tip syringe (in the same manner as the last feed) to supplement an additional 5 mls before she became sleepy and stopped sucking.  Mother burped well and baby was swaddled tightly with mother holding her quietly on her chest.  Mother was very pleased with this method and wants to continue in this manner.  She does not want to give an artificial nipple at this time since she is progressing slowly with this feeding plan.  I did notice that her suck on my gloved finger became more coordinated and she sucked for a minute before becoming agitated.  I suggested to continue suck training.  I asked father to participate in observing and learning this method so he could help mother throughout the night.  He is willing to  assist.  Mother would like my assistance one more time tonight and I asked her to call when baby shows cues.  I will await her call.  RN updated.      Maternal Data Formula Feeding for Exclusion: No Has patient been taught Hand Expression?: Yes Does the patient have breastfeeding experience prior to this delivery?: Yes  Feeding Feeding Type: Breast Fed  LATCH Score Latch: Repeated attempts needed to sustain latch, nipple held in mouth throughout feeding, stimulation needed to elicit sucking reflex.  Audible Swallowing: A few with stimulation  Type of Nipple: Everted at rest and after stimulation  Comfort (Breast/Nipple): Soft / non-tender  Hold (Positioning): Assistance needed to correctly position infant at breast and maintain latch.  LATCH Score: 7  Interventions Interventions: Breast feeding basics reviewed;Assisted with latch;Skin to skin;Breast massage;Hand express;Position options;Support pillows;Adjust position;Breast compression;DEBP  Lactation Tools Discussed/Used Tools: Pump;Other (comment)(curved tip syringe) Breast pump type: Double-Electric Breast Pump Initiated by:: Already set up in room   Consult Status Consult Status: Follow-up Date: 08/19/18 Follow-up type: In-patient    Dora Sims 08/18/2018, 5:39 PM

## 2018-08-18 NOTE — Lactation Note (Signed)
This note was copied from a baby's chart. Lactation Consultation Note  Patient Name: Rebekah Chavez ZOXWR'U Date: 08/18/2018 Reason for consult: Follow-up assessment;Difficult latch;Early term 37-38.6wks;Infant < 6lbs  P2 mother whose infant is now 105 hours old.  Baby is a NAS and will stay in the hospital for 5 days.  When I entered the room for a follow up visit the mother had recently bottle fed (with breast milk) 7 mls.  She and her RN noted that baby does not stay at the breast or at the bottle for more than a couple of minutes.  She becomes very restless.    Since baby is awake and fussy I offered to assist with latching and mother accepted.  Positioned baby in the football hold on the right breast.  Baby was very restless and thrashing at the breast.  Allowed her to suck on my gloved finger to calm down and her suck was noted to be very disorganized and not effective.  Practiced a little bit of suck training but baby was very fussy.  She latched a couple of times but only managed to get 2-3 sucks each time before refusing to calm down.   Mother's breasts are full and she easily expresses large amounts of EBM.  Attempted a few times to latch in the football hold but baby still refusing and agitated.  I burped her and offered to try a different position and mother accepted.   Attempted to latch in the cross cradle hold to the left breast with the same results.  Baby continues to refuse and became agitated at the breast.  She latched twice but would not begin sucking.  Even with my hands containing her and speaking softly she continued to be fussy and would not latch.  I suggested we try to feed her in a manner that didi not involve artificial nipples to see how she would react.  Mother pleased with this idea.  Obtained a curved tip syringe and demonstrated finger feeding with this method and baby took a few good sucks with this but still remained agitated.  I continued to work with her and she  was able to take 20 mls of mother's milk before burping and calming down.  A moderate mucus emesis was noted with burping.    Showed mother and family members how to tightly swaddle baby for comfort, dim lights and decrease stimulation.  Encouraged STS and quiet environment.    Spoke with RN and we are going to continue this plan of breast feeding followed by supplementation with curved tip syringe for supplementation.  Discussed the importance of being consistent with this plan and awakening every 3 hours or feeding sooner if baby shows cues.  Mother verbalized understanding of this plan.  I reminded her that it will take practice before baby learns to feed.  I offered to return at the 1700 feeding or earlier if mother calls for assistance.  I will await mother's call.     Maternal Data Formula Feeding for Exclusion: No Has patient been taught Hand Expression?: Yes Does the patient have breastfeeding experience prior to this delivery?: Yes  Feeding Feeding Type: Breast Fed Nipple Type: Slow - flow  LATCH Score Latch: Repeated attempts needed to sustain latch, nipple held in mouth throughout feeding, stimulation needed to elicit sucking reflex.  Audible Swallowing: A few with stimulation  Type of Nipple: Everted at rest and after stimulation  Comfort (Breast/Nipple): Soft / non-tender  Hold (Positioning): Assistance needed  to correctly position infant at breast and maintain latch.  LATCH Score: 7  Interventions Interventions: Breast feeding basics reviewed;Assisted with latch;Skin to skin;Breast massage;Hand express;Position options;Support pillows;Adjust position;Breast compression;DEBP  Lactation Tools Discussed/Used Tools: Pump Breast pump type: Double-Electric Breast Pump Initiated by:: Already set up in room   Consult Status Consult Status: Follow-up Date: 08/19/18 Follow-up type: In-patient    Deb Loudin R [name] 08/18/2018, 2:42 PM

## 2018-08-18 NOTE — Progress Notes (Signed)
Subjective: Postpartum Day 1: Cesarean Delivery Patient reports tolerating PO, + flatus and no problems voiding.  She denies headache, vision change, and RUQ pain. When asked about vaginal bleeding she stated " I'm not really bleeding". Patient is currently breastfeeding and bottle feeding. She requests Nexplanon for birth control.   Objective: Vital signs in last 24 hours: Temp:  [98 F (36.7 C)-98.3 F (36.8 C)] 98 F (36.7 C) (10/09 0417) Pulse Rate:  [61-78] 69 (10/09 0417) Resp:  [16-18] 16 (10/09 0417) BP: (118-132)/(80-86) 118/81 (10/09 0417) SpO2:  [98 %-99 %] 99 % (10/08 1158)  Physical Exam:  General: alert and cooperative  Heart: Regular rate and rhythm, no rubs noted. Lungs: Clear to auscultation bilaterally.  Lochia: appropriate Uterine Fundus: firm, palpable, no abdominal pain produced on palpation of the abdomen.  Incision: Bandage intact.  DVT Evaluation: No evidence of DVT seen on physical exam.  Recent Labs    08/17/18 0616 08/18/18 0712  HGB 7.5* 6.6*  HCT 21.6* 19.9*    Assessment/Plan: Status post Cesarean section. Doing well postoperatively. Patient is tolerating PO intake and ambulation well.   Charyl Dancer 08/18/2018, 7:43 AM  Patient seen in conjunction with Charyl Dancer, PA-S  Some lightheadedness and dizziness with ambulation. Had feraheme yesterday. Tolerating PO. No other concerns. Lochia normal.  BP 118/81 (BP Location: Right Arm)   Pulse 69   Temp 98 F (36.7 C) (Oral)   Resp 16   Ht 5\' 9"  (1.753 m)   Wt 76.6 kg   LMP 11/10/2017 (Approximate)   SpO2 99%   Breastfeeding? Unknown   BMI 24.93 kg/m  A&Ox3, NAD nonlabored breathing Fundus firm, normal lochia Ext warm, no evidence of DVT  A/P Postop day #2 Symptomatic anemia due to acute blood loss anemia.  Transfuse 2 units. Discussed risks: transfusion rxn, hemolytic anemia, hep b/c, HIV. Patient agreeable Continue other routine care.  Levie Heritage, DO 08/18/2018 7:59 AM

## 2018-08-19 ENCOUNTER — Encounter (HOSPITAL_COMMUNITY)
Admission: RE | Admit: 2018-08-19 | Discharge: 2018-08-19 | Disposition: A | Discharge status: Home / Self Care | Payer: Medicaid Other | Source: Ambulatory Visit | Attending: Obstetrics and Gynecology | Admitting: Obstetrics and Gynecology

## 2018-08-19 ENCOUNTER — Encounter: Payer: Medicaid Other | Admitting: Obstetrics

## 2018-08-19 MED ORDER — METHADONE HCL 10 MG/ML PO CONC: 80.0000 mg | Freq: Every day | ORAL | 0 refills | Status: DC

## 2018-08-19 MED ORDER — IBUPROFEN 600 MG PO TABS: 600.0000 mg | ORAL_TABLET | Freq: Four times a day (QID) | ORAL | 0 refills | Status: AC

## 2018-08-19 MED ORDER — OXYCODONE HCL 5 MG PO TABS: 5.0000 mg | ORAL_TABLET | ORAL | 0 refills | Status: AC | PRN

## 2018-08-19 NOTE — Discharge Instructions (Signed)

## 2018-08-19 NOTE — Discharge Summary (Signed)
Postpartum Discharge Summary     Patient Name: Rebekah Chavez DOB: Jan 07, 1986 MRN: [id number]  Date of admission: 08/16/2018 Delivering Provider: Conan Bowens   Date of discharge: 08/19/2018  Admitting diagnosis: RCS Undesired Fertility Intrauterine pregnancy: [redacted]w[redacted]d     Secondary diagnosis:  Principal Problem:   Cholestasis during pregnancy in third trimester Active Problems:   Supervision of high risk pregnancy, antepartum   Previous cesarean section complicating pregnancy, antepartum condition or complication   Substance abuse affecting pregnancy, antepartum   Late prenatal care affecting pregnancy, antepartum   Trichimoniasis   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 yo [id number] at [redacted]w[redacted]d was admitted to the hospital 08/16/2018 for scheduled cesarean section with the following indication:Elective Repeat. w cholestasis  Membrane Rupture Time/Date: 10:15 AM ,08/16/2018   Patient delivered a Viable infant.08/16/2018  Details of operation can be found in separate operative note.  Pateint had an uncomplicated postpartum course.  She is ambulating, tolerating a regular diet, passing flatus, and urinating well. Patient is discharged home in stable condition on  08/19/18         Magnesium Sulfate recieved: No BMZ received: No  Physical exam  Vitals:   08/18/18 1035 08/18/18 1108 08/18/18 1203 08/18/18 2259  BP: 132/80 118/88 125/75 129/81  Pulse:  82 72 79  Resp:  20 18   Temp:  98.3 F (36.8 C) 98 F (36.7 C)   TempSrc:  Oral Oral   SpO2:  96% 100%   Weight:      Height:        General: alert, cooperative and no distress Lochia: appropriate Uterine Fundus: firm Incision: Healing well with no significant drainage, Dressing is clean, dry, and intact DVT Evaluation: No evidence of DVT seen on physical exam. Labs: Lab Results  Component Value Date   WBC 5.0 08/18/2018   HGB 8.2 (L) 08/18/2018   HCT 23.8 (L) 08/18/2018   MCV 89.5 08/18/2018   PLT 141 (L) 08/18/2018   CMP Latest Ref Rng & Units 08/17/2018  Glucose 70 - 99 mg/dL -  BUN 6 - 20 mg/dL -  Creatinine 1.47 - 8.29 mg/dL 5.62  Sodium 130 - 865 mmol/L -  Potassium 3.5 - 5.1 mmol/L -  Chloride 98 - 111 mmol/L -  CO2 22 - 32 mmol/L -  Calcium 8.9 - 10.3 mg/dL -  Total Protein 6.5 - 8.1 g/dL -  Total Bilirubin 0.3 - 1.2 mg/dL -  Alkaline Phos 38 - 784 U/L -  AST 15 - 41 U/L -  ALT 0 - 44 U/L -    Discharge instruction: per After Visit Summary and "  Baby and Me Booklet".  After visit meds:  Allergies as of 08/19/2018   No Known Allergies     Medication List    TAKE these medications   CONCEPT OB 130-92.4-1 MG Caps Take 1 capsule by mouth daily.   ibuprofen 600 MG tablet Commonly known as:  ADVIL,MOTRIN Take 1 tablet (600 mg total) by mouth every 6 (six) hours.   methadone 10 MG/ML solution Commonly known as:  DOLOPHINE Take 80 mg by mouth daily. [facility] What changed:  Another medication with the same name was removed. Continue taking this medication, and follow the directions you see here.   PNV PO Take 1 tablet by mouth daily.   polyethylene glycol powder powder Commonly known as:  GLYCOLAX/MIRALAX Take by mouth daily by the package directions      (Methadone to be continued at clinic in [place])  Diet: routine diet  Activity: Advance as tolerated. Pelvic rest for 6 weeks.   Outpatient follow up:2 weeks Follow up Appt: Future Appointments  Date Time Provider Department Center  08/30/2018 10:00 AM Leftwich-Kirby, Wilmer Floor, CNM [facility] None   09/13/2018 10:45 AM Leftwich-Kirby, Wilmer Floor, CNM [facility] None   Follow up Visit:No follow-ups on file.   Please schedule this patient for Postpartum visit in: 2 week with the following provider: MD For C/S patients schedule nurse incision check in weeks 2 weeks: yes High risk pregnancy complicated by: cholestasis of pregnancy, methadone use Delivery mode:  CS Anticipated Birth Control:  Nexplanon PP Procedures needed: Incision check  Schedule Integrated BH visit: no   Newborn Data: Live born female  Birth Weight: 5 lb 10.7 oz (2570 g) APGAR: 8, 9  Newborn Delivery   Birth date/time:  08/16/2018 10:16:00 Delivery type:  C-Section, Low Transverse Trial of labor:  No C-section categorization:  Repeat     Baby Feeding: Bottle Disposition:pending CPS   08/19/2018 Oralia Manis, DO PGY-2

## 2018-08-19 NOTE — Progress Notes (Signed)
CSW spoke with [place] CPS worker Charmaine Gaines regarding safety disposition plan for infant.  CPS will meet with [name] this morning to establish a safety discharge plan.  CSW requested that CPS contact CSW prior to CPS leaving the hospital to provide CSW with an update.   Rebekah Chavez, MSW, LCSW Clinical Social Work [phone]  
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
## 2018-08-20 ENCOUNTER — Encounter (HOSPITAL_COMMUNITY): Admission: RE | Admit: 2018-08-20 | Payer: Medicaid Other | Source: Ambulatory Visit

## 2018-08-20 LAB — TYPE AND SCREEN
ABO/RH(D): O POS
Antibody Screen: NEGATIVE
UNIT DIVISION: 0
Unit division: 0
Unit division: 0
Unit division: 0

## 2018-08-20 LAB — BPAM RBC
BLOOD PRODUCT EXPIRATION DATE: 201910232359
Blood Product Expiration Date: 201910192359
Blood Product Expiration Date: 201910202359
Blood Product Expiration Date: 201910232359
ISSUE DATE / TIME: 201910090956
ISSUE DATE / TIME: 201910101600
ISSUE DATE / TIME: 201910101600
UNIT TYPE AND RH: 5100
Unit Type and Rh: 5100
Unit Type and Rh: 9500
Unit Type and Rh: 9500

## 2018-08-23 ENCOUNTER — Ambulatory Visit: Payer: Self-pay

## 2018-08-23 NOTE — Lactation Note (Signed)
This note was copied from a baby's chart. Lactation Consultation Note  Patient Name: Rebekah Chavez ZOXWR'U Date: 08/23/2018   Baby 26 days old.  Mother has Hep C and is aware of precautions. She is primarily pumping and bottle feeding.  She states she has been pumping 6-8 oz. Instructed mother how to convert DEBP to manual and provided mother w/ manual pump. Reviewed frequency and milk storage. Faxed for pump referral sent to [facility]. Reviewed engorgement care and monitoring voids/stools.      Maternal Data    Feeding Feeding Type: Bottle Fed - Breast Milk  LATCH Score                   Interventions    Lactation Tools Discussed/Used     Consult Status      Hardie Pulley 08/23/2018, 11:07 AM

## 2018-08-30 ENCOUNTER — Ambulatory Visit: Payer: Medicaid Other | Admitting: Obstetrics and Gynecology

## 2018-09-13 ENCOUNTER — Ambulatory Visit: Payer: Medicaid Other | Admitting: Advanced Practice Midwife

## 2018-09-22 ENCOUNTER — Encounter: Payer: Self-pay | Admitting: Advanced Practice Midwife

## 2018-10-12 ENCOUNTER — Telehealth: Payer: Self-pay

## 2018-10-12 NOTE — Telephone Encounter (Signed)
Spoke with pt and she stated that she had c section 08-16-18, pt started bleeding and cramping today, going through a tampon every 20 minutes and having clots. Advised pt to be evaluated at hospital.

## 2019-04-16 IMAGING — US US MFM OB DETAIL+14 WK
1 series · 12 of 28 positions shown · non-contrast
Comparison: none

[Series 1: us mfm ob detail+14 wk · 85 acquisitions, 12 frames shown]
[im 4/85]
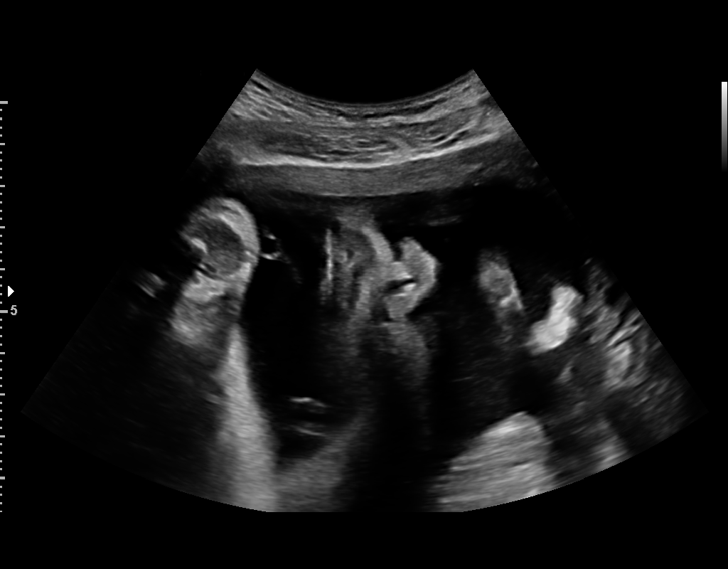
[im 10/85]
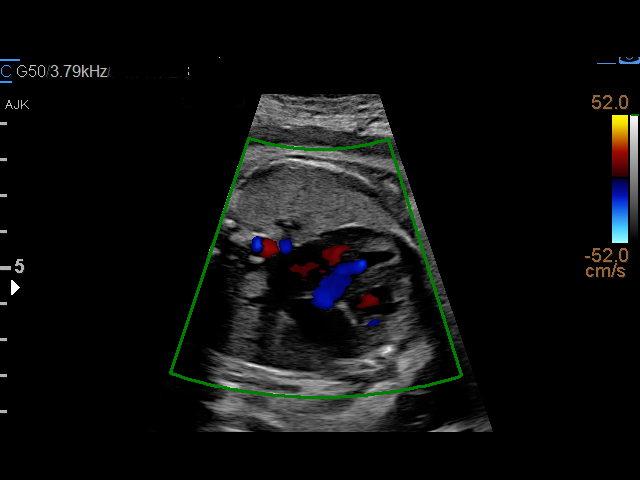
[im 16/85]
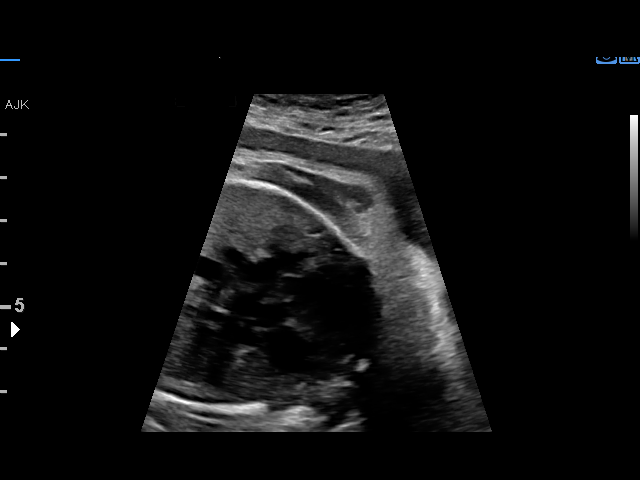
[im 25/85]
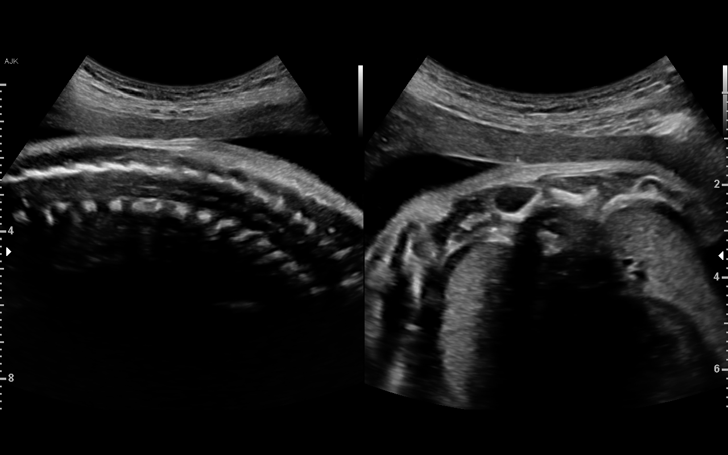
[im 32/85]
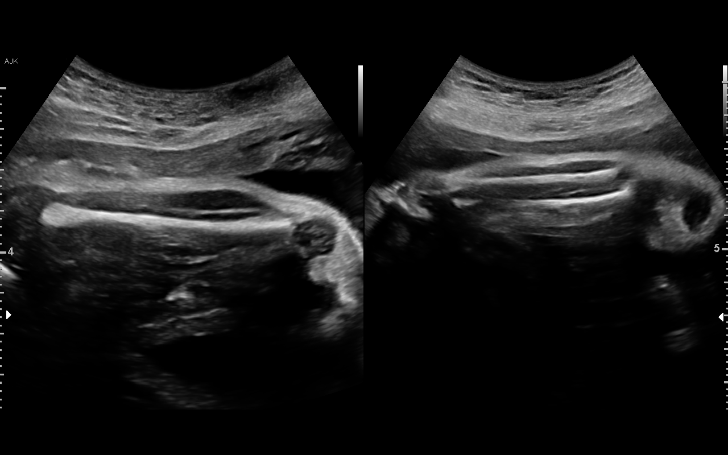
[im 38/85]
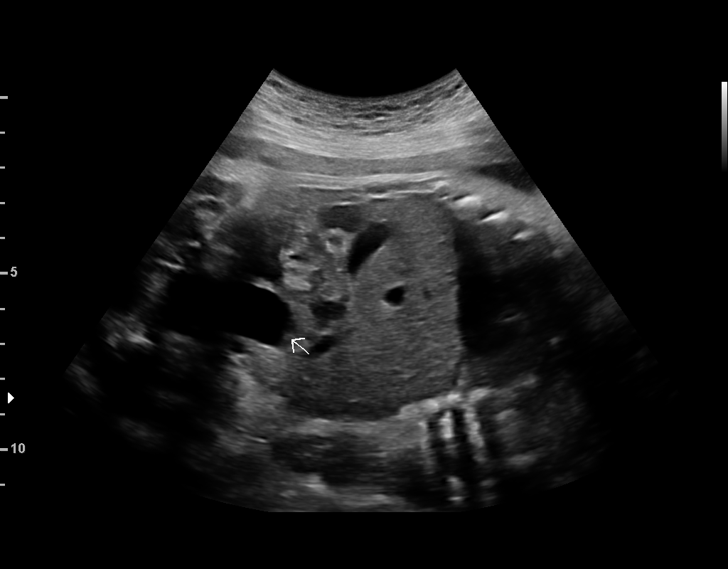
[im 47/85]
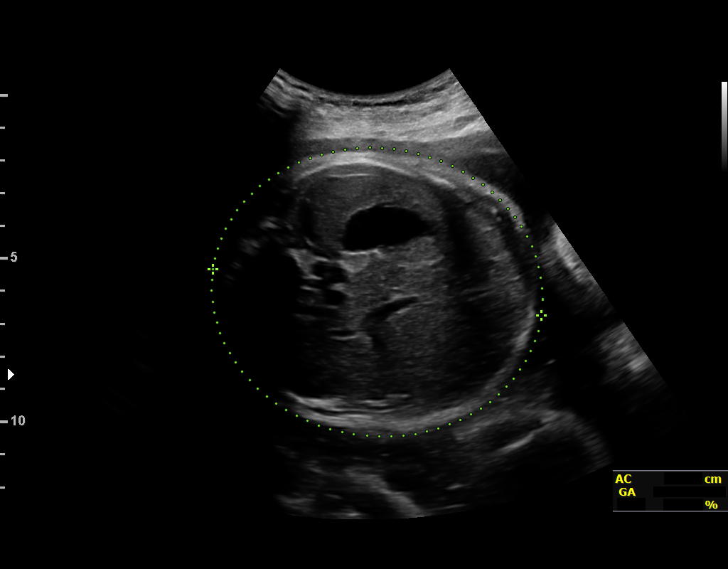
[im 53/85]
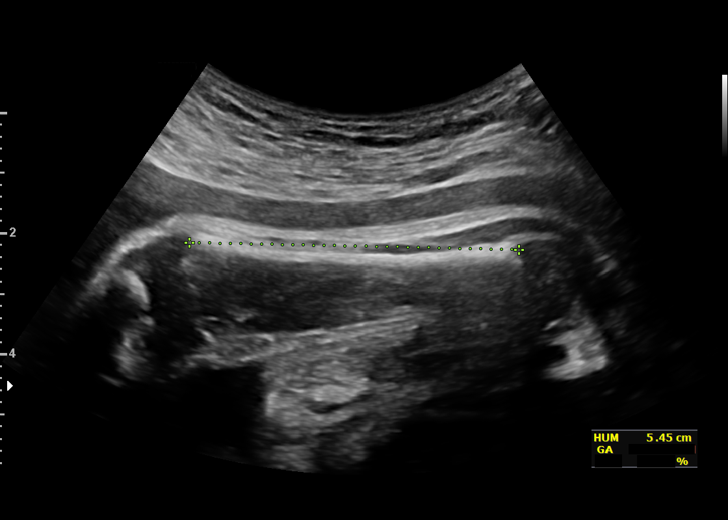
[im 60/85]
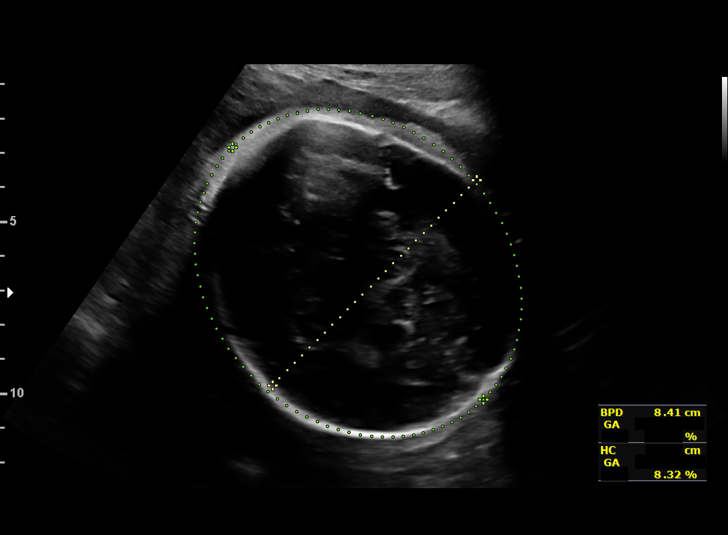
[im 69/85]
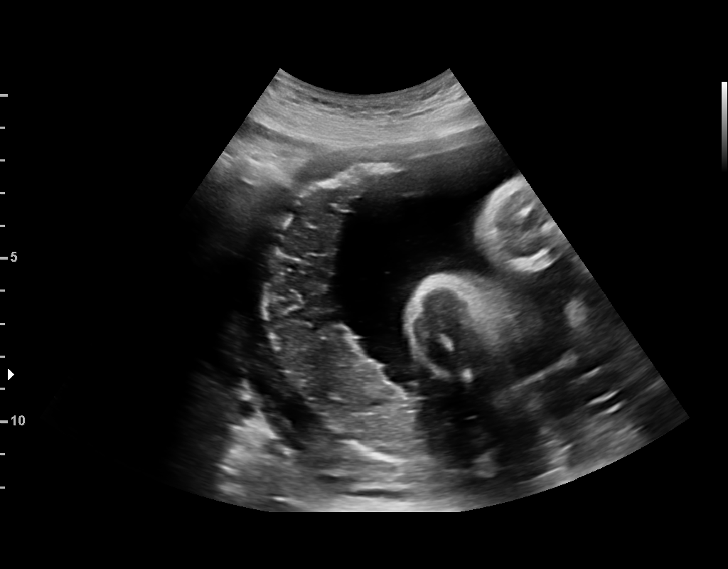
[im 75/85]
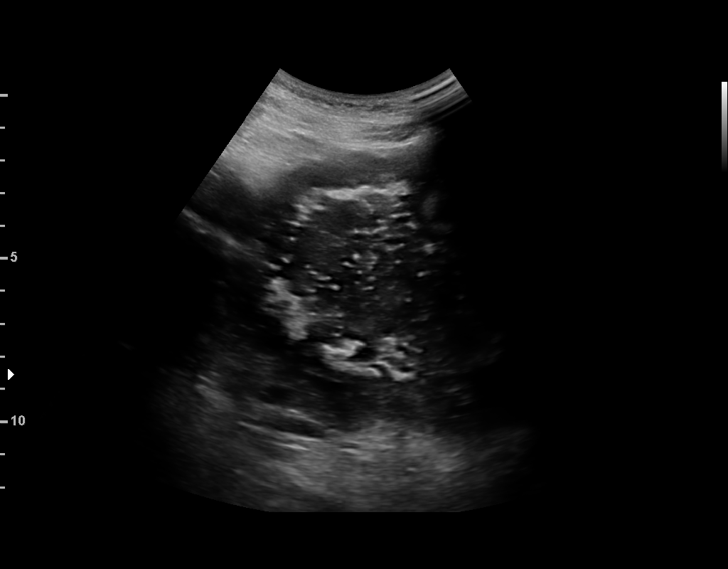
[im 81/85]
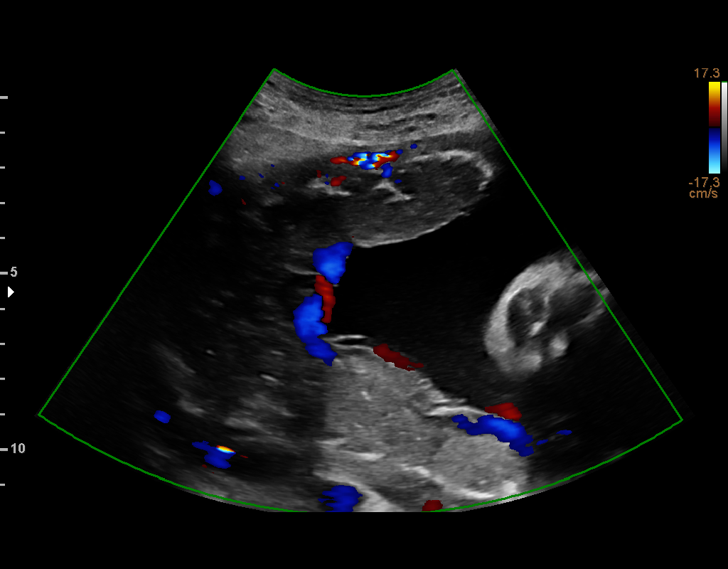

[12 of 28 positions shown; findings below may reference images not displayed]

Indications

Encounter for antenatal screening for
malformations
Drug use complicating pregnancy, third
trimester (methadone, heroin)
Chronic Hepatitis C complicating pregnancy,
antepartum
Genetic carrier (SMA)
Previous cesarean delivery, antepartum
Poor obstetric history: Previous preterm
delivery, antepartum (34 weeks)
Late to prenatal care, third trimester
Poor obstetric history: Previous fetal growth
restriction (FGR) (34 weeks delivery-7lbs) ?
33 weeks gestation of pregnancy
Vital Signs

BMI:
Fetal Evaluation

Num Of Fetuses:         1
Cardiac Activity:       Observed
Presentation:           Cephalic
Placenta:               Posterior
P. Cord Insertion:      Visualized

Amniotic Fluid
AFI FV:      Within normal limits

AFI Sum(cm)     %Tile       Largest Pocket(cm)
18.14           67
RUQ(cm)       RLQ(cm)       LUQ(cm)        LLQ(cm)
3.38
Biometry

BPD:        82  mm     G. Age:  33w 0d         28  %    CI:        77.29   %    70 - 86
FL/HC:      20.4   %    19.4 -
HC:      295.3  mm     G. Age:  32w 4d          5  %    HC/AC:      0.99        0.96 -
AC:      298.9  mm     G. Age:  33w 6d         61  %    FL/BPD:     73.3   %    71 - 87
FL:       60.1  mm     G. Age:  31w 2d        < 3  %    FL/AC:      20.1   %    20 - 24
HUM:      54.3  mm     G. Age:  31w 4d         18  %

LV:        3.5  mm

Est. FW:    5196  gm      4 lb 9 oz     46  %
OB History

Gravidity:    4         Term:   0        Prem:   1        SAB:   0
TOP:          2       Ectopic:  0        Living: 1
Gestational Age

LMP:           35w 2d        Date:  11/10/17                 EDD:   08/17/18
U/S Today:     32w 5d                                        EDD:   09/04/18
Best:          33w 4d     Det. By:  Previous Ultrasound      EDD:   08/29/18
(01/22/18)
Anatomy

Cranium:               Appears normal         LVOT:                   Appears normal
Cavum:                 Not well visualized    Aortic Arch:            Appears normal
Ventricles:            Appears normal         Ductal Arch:            limited views
appear normal
Choroid Plexus:        Appears normal         Diaphragm:              Appears normal
Cerebellum:            Appears normal         Stomach:                Appears normal, left
sided
Posterior Fossa:       Not well visualized    Abdomen:                Appears normal
Nuchal Fold:           Not applicable (>20    Abdominal Wall:         Not well visualized
wks GA)
Face:                  Appears normal         Cord Vessels:           Appears normal (3
(orbits and profile)                           vessel cord)
Lips:                  Appears normal         Kidneys:                Appear normal
Palate:                Not well visualized    Bladder:                Appears normal
Thoracic:              Appears normal         Spine:                  Appears normal
Heart:                 Appears normal         Upper Extremities:      limited views nml
(4CH, axis, and
situs)
RVOT:                  Appears normal         Lower Extremities:      limited views nml

Other:  Fetus appears to be a female. Technically difficult due to advanced
gestational age.
Cervix Uterus Adnexa

Cervix
Not visualized (advanced GA >43wks)

Uterus
No abnormality visualized.
Left Ovary
Not visualized.

Right Ovary
Not visualized.

Adnexa
No abnormality visualized.
Impression

Patient with history of substance use is here for ultrasound
evaluation. She also has hepatitis C infection. Recent viral
RNA (HCV log40) is high (6.551).

Patient is also a carrier of ZTT-2 gene and her partner was
not screened.
She had discontinued methadone and reports she will be
resuming it now.

On ultrasound, the fetal growth is appropriate for the
gestational age. Amniotic fluid is normal and good fetal
activity is seen. Fetal anatomy appears normal, but limited
because of advanced gestational age.

I reassured the couple that we do not find evidence of fetal
growth restriction.
Recommendations

An appointment was made for her to return in 4 weeks for
fetal growth assessment.
